# Patient Record
Sex: Male | Born: 1940 | Race: White | Hispanic: No | Marital: Married | Smoking: Former smoker
Health system: Southern US, Community
[De-identification: ages and names within clinical notes are randomized; demographics above are authoritative.]

## PROBLEM LIST (undated history)

## (undated) DIAGNOSIS — Z85118 Personal history of other malignant neoplasm of bronchus and lung: Secondary | ICD-10-CM

## (undated) DIAGNOSIS — M47818 Spondylosis without myelopathy or radiculopathy, sacral and sacrococcygeal region: Secondary | ICD-10-CM

## (undated) DIAGNOSIS — Z950 Presence of cardiac pacemaker: Secondary | ICD-10-CM

## (undated) DIAGNOSIS — J302 Other seasonal allergic rhinitis: Secondary | ICD-10-CM

## (undated) DIAGNOSIS — N401 Enlarged prostate with lower urinary tract symptoms: Secondary | ICD-10-CM

## (undated) DIAGNOSIS — I209 Angina pectoris, unspecified: Secondary | ICD-10-CM

## (undated) DIAGNOSIS — Z9889 Other specified postprocedural states: Secondary | ICD-10-CM

## (undated) DIAGNOSIS — R55 Syncope and collapse: Secondary | ICD-10-CM

## (undated) DIAGNOSIS — I272 Pulmonary hypertension, unspecified: Secondary | ICD-10-CM

## (undated) DIAGNOSIS — R972 Elevated prostate specific antigen [PSA]: Secondary | ICD-10-CM

## (undated) DIAGNOSIS — M533 Sacrococcygeal disorders, not elsewhere classified: Secondary | ICD-10-CM

## (undated) DIAGNOSIS — Z7901 Long term (current) use of anticoagulants: Secondary | ICD-10-CM

## (undated) DIAGNOSIS — N138 Other obstructive and reflux uropathy: Secondary | ICD-10-CM

## (undated) DIAGNOSIS — I4891 Unspecified atrial fibrillation: Secondary | ICD-10-CM

## (undated) DIAGNOSIS — I10 Essential (primary) hypertension: Secondary | ICD-10-CM

## (undated) DIAGNOSIS — Z5181 Encounter for therapeutic drug level monitoring: Secondary | ICD-10-CM

## (undated) DIAGNOSIS — I208 Other forms of angina pectoris: Secondary | ICD-10-CM

## (undated) DIAGNOSIS — N200 Calculus of kidney: Secondary | ICD-10-CM

## (undated) DIAGNOSIS — I495 Sick sinus syndrome: Secondary | ICD-10-CM

## (undated) DIAGNOSIS — I480 Paroxysmal atrial fibrillation: Secondary | ICD-10-CM

## (undated) DIAGNOSIS — N411 Chronic prostatitis: Secondary | ICD-10-CM

## (undated) DIAGNOSIS — E781 Pure hyperglyceridemia: Secondary | ICD-10-CM

## (undated) DIAGNOSIS — K219 Gastro-esophageal reflux disease without esophagitis: Secondary | ICD-10-CM

## (undated) DIAGNOSIS — I2510 Atherosclerotic heart disease of native coronary artery without angina pectoris: Secondary | ICD-10-CM

## (undated) HISTORY — DX: Pure hyperglyceridemia: E78.1

## (undated) HISTORY — DX: Chronic prostatitis: N41.1

## (undated) HISTORY — DX: Presence of cardiac pacemaker: Z95.0

## (undated) HISTORY — DX: Atherosclerotic heart disease of native coronary artery without angina pectoris: I25.10

## (undated) HISTORY — DX: Other seasonal allergic rhinitis: J30.2

## (undated) HISTORY — DX: Calculus of kidney: N20.0

## (undated) HISTORY — DX: Paroxysmal atrial fibrillation: I48.0

## (undated) HISTORY — DX: Unspecified atrial fibrillation: I48.91

## (undated) HISTORY — PX: CARDIAC ELECTROPHYSIOLOGY MAPPING AND ABLATION: SHX1292

## (undated) HISTORY — DX: Syncope and collapse: R55

## (undated) HISTORY — DX: Other forms of angina pectoris: I20.8

## (undated) HISTORY — DX: Long term (current) use of anticoagulants: Z79.01

## (undated) HISTORY — DX: Essential (primary) hypertension: I10

## (undated) HISTORY — DX: Gastro-esophageal reflux disease without esophagitis: K21.9

## (undated) HISTORY — DX: Sacrococcygeal disorders, not elsewhere classified: M53.3

## (undated) HISTORY — PX: LUNG LOBECTOMY: SHX167

## (undated) HISTORY — DX: Benign prostatic hyperplasia with lower urinary tract symptoms: N40.1

## (undated) HISTORY — DX: Sick sinus syndrome: I49.5

## (undated) HISTORY — DX: Angina pectoris, unspecified: I20.9

## (undated) HISTORY — DX: Encounter for therapeutic drug level monitoring: Z51.81

## (undated) HISTORY — DX: Pulmonary hypertension, unspecified: I27.20

## (undated) HISTORY — DX: Personal history of other malignant neoplasm of bronchus and lung: Z85.118

## (undated) HISTORY — DX: Other specified postprocedural states: Z98.890

## (undated) HISTORY — DX: Elevated prostate specific antigen (PSA): R97.20

## (undated) HISTORY — DX: Other obstructive and reflux uropathy: N13.8

## (undated) HISTORY — DX: Spondylosis without myelopathy or radiculopathy, sacral and sacrococcygeal region: M47.818

---

## 2006-09-11 ENCOUNTER — Ambulatory Visit: Payer: Self-pay | Admitting: Physician Assistant

## 2008-07-15 ENCOUNTER — Ambulatory Visit: Payer: Self-pay | Admitting: Unknown Physician Specialty

## 2008-08-04 ENCOUNTER — Ambulatory Visit: Payer: Self-pay | Admitting: Unknown Physician Specialty

## 2008-08-25 ENCOUNTER — Ambulatory Visit: Payer: Self-pay | Admitting: Unknown Physician Specialty

## 2009-03-18 ENCOUNTER — Emergency Department: Payer: Self-pay | Admitting: Emergency Medicine

## 2009-03-22 ENCOUNTER — Other Ambulatory Visit: Payer: Self-pay | Admitting: Internal Medicine

## 2009-08-28 ENCOUNTER — Inpatient Hospital Stay: Payer: Self-pay | Admitting: Internal Medicine

## 2009-09-03 ENCOUNTER — Inpatient Hospital Stay: Payer: Self-pay | Admitting: Internal Medicine

## 2009-09-07 ENCOUNTER — Other Ambulatory Visit: Payer: Self-pay | Admitting: Internal Medicine

## 2009-09-07 ENCOUNTER — Inpatient Hospital Stay: Payer: Self-pay | Admitting: Internal Medicine

## 2009-10-15 ENCOUNTER — Ambulatory Visit: Payer: Self-pay | Admitting: Nurse Practitioner

## 2010-02-01 ENCOUNTER — Emergency Department: Payer: Self-pay | Admitting: Emergency Medicine

## 2010-05-05 ENCOUNTER — Ambulatory Visit: Payer: Self-pay | Admitting: Unknown Physician Specialty

## 2010-09-24 ENCOUNTER — Inpatient Hospital Stay: Payer: Self-pay | Admitting: Internal Medicine

## 2011-11-30 ENCOUNTER — Ambulatory Visit: Payer: Self-pay | Admitting: Internal Medicine

## 2011-12-05 ENCOUNTER — Ambulatory Visit: Payer: Self-pay | Admitting: Internal Medicine

## 2011-12-22 ENCOUNTER — Ambulatory Visit: Payer: Self-pay | Admitting: Internal Medicine

## 2012-01-31 ENCOUNTER — Ambulatory Visit: Payer: Self-pay | Admitting: Ophthalmology

## 2012-06-12 ENCOUNTER — Ambulatory Visit: Payer: Self-pay | Admitting: Ophthalmology

## 2012-07-10 HISTORY — PX: PACEMAKER IMPLANT: EP1218

## 2013-01-24 DIAGNOSIS — R972 Elevated prostate specific antigen [PSA]: Secondary | ICD-10-CM

## 2013-01-24 DIAGNOSIS — N411 Chronic prostatitis: Secondary | ICD-10-CM

## 2013-01-24 DIAGNOSIS — G894 Chronic pain syndrome: Secondary | ICD-10-CM | POA: Insufficient documentation

## 2013-01-24 HISTORY — DX: Chronic prostatitis: N41.1

## 2013-01-24 HISTORY — DX: Elevated prostate specific antigen (PSA): R97.20

## 2014-01-20 DIAGNOSIS — I2089 Other forms of angina pectoris: Secondary | ICD-10-CM | POA: Insufficient documentation

## 2014-01-20 DIAGNOSIS — I4891 Unspecified atrial fibrillation: Secondary | ICD-10-CM

## 2014-01-20 DIAGNOSIS — I209 Angina pectoris, unspecified: Secondary | ICD-10-CM

## 2014-01-20 DIAGNOSIS — Z85118 Personal history of other malignant neoplasm of bronchus and lung: Secondary | ICD-10-CM

## 2014-01-20 DIAGNOSIS — I10 Essential (primary) hypertension: Secondary | ICD-10-CM | POA: Insufficient documentation

## 2014-01-20 DIAGNOSIS — R55 Syncope and collapse: Secondary | ICD-10-CM

## 2014-01-20 DIAGNOSIS — K219 Gastro-esophageal reflux disease without esophagitis: Secondary | ICD-10-CM | POA: Insufficient documentation

## 2014-01-20 DIAGNOSIS — I495 Sick sinus syndrome: Secondary | ICD-10-CM

## 2014-01-20 DIAGNOSIS — E781 Pure hyperglyceridemia: Secondary | ICD-10-CM

## 2014-01-20 DIAGNOSIS — M542 Cervicalgia: Secondary | ICD-10-CM | POA: Insufficient documentation

## 2014-01-20 DIAGNOSIS — I208 Other forms of angina pectoris: Secondary | ICD-10-CM

## 2014-01-20 DIAGNOSIS — I2510 Atherosclerotic heart disease of native coronary artery without angina pectoris: Secondary | ICD-10-CM

## 2014-01-20 HISTORY — DX: Unspecified atrial fibrillation: I48.91

## 2014-01-20 HISTORY — DX: Other forms of angina pectoris: I20.89

## 2014-01-20 HISTORY — DX: Essential (primary) hypertension: I10

## 2014-01-20 HISTORY — DX: Angina pectoris, unspecified: I20.9

## 2014-01-20 HISTORY — DX: Other forms of angina pectoris: I20.8

## 2014-01-20 HISTORY — DX: Pure hyperglyceridemia: E78.1

## 2014-01-20 HISTORY — DX: Syncope and collapse: R55

## 2014-01-20 HISTORY — DX: Personal history of other malignant neoplasm of bronchus and lung: Z85.118

## 2014-01-20 HISTORY — DX: Sick sinus syndrome: I49.5

## 2014-01-20 HISTORY — DX: Atherosclerotic heart disease of native coronary artery without angina pectoris: I25.10

## 2014-03-04 DIAGNOSIS — Z9289 Personal history of other medical treatment: Secondary | ICD-10-CM | POA: Insufficient documentation

## 2014-03-04 DIAGNOSIS — Z9889 Other specified postprocedural states: Secondary | ICD-10-CM | POA: Insufficient documentation

## 2014-03-04 HISTORY — DX: Other specified postprocedural states: Z98.890

## 2014-04-02 ENCOUNTER — Ambulatory Visit: Payer: Self-pay | Admitting: Cardiology

## 2014-04-02 LAB — URINALYSIS, COMPLETE
BILIRUBIN, UR: NEGATIVE
Bacteria: NONE SEEN
Blood: NEGATIVE
Glucose,UR: NEGATIVE mg/dL (ref 0–75)
Ketone: NEGATIVE
Leukocyte Esterase: NEGATIVE
Nitrite: NEGATIVE
PROTEIN: NEGATIVE
Ph: 5 (ref 4.5–8.0)
RBC,UR: NONE SEEN /HPF (ref 0–5)
SPECIFIC GRAVITY: 1.017 (ref 1.003–1.030)
Squamous Epithelial: NONE SEEN
WBC UR: <1 /HPF (ref 0–5)

## 2014-04-02 LAB — BASIC METABOLIC PANEL
Anion Gap: 7 (ref 7–16)
BUN: 17 mg/dL (ref 7–18)
CALCIUM: 8.6 mg/dL (ref 8.5–10.1)
CREATININE: 1.12 mg/dL (ref 0.60–1.30)
Chloride: 108 mmol/L — ABNORMAL HIGH (ref 98–107)
Co2: 26 mmol/L (ref 21–32)
EGFR (African American): >60
EGFR (Non-African Amer.): >60
Glucose: 89 mg/dL (ref 65–99)
OSMOLALITY: 282 (ref 275–301)
POTASSIUM: 4.3 mmol/L (ref 3.5–5.1)
Sodium: 141 mmol/L (ref 136–145)

## 2014-04-02 LAB — CBC WITH DIFFERENTIAL/PLATELET
Basophil #: 0.1 10*3/uL (ref 0.0–0.1)
Basophil %: 1.1 %
EOS PCT: 5.4 %
Eosinophil #: 0.5 10*3/uL (ref 0.0–0.7)
HCT: 43 % (ref 40.0–52.0)
HGB: 14.6 g/dL (ref 13.0–18.0)
Lymphocyte #: 2.2 10*3/uL (ref 1.0–3.6)
Lymphocyte %: 21.9 %
MCH: 29.7 pg (ref 26.0–34.0)
MCHC: 34 g/dL (ref 32.0–36.0)
MCV: 87 fL (ref 80–100)
MONO ABS: 0.8 x10 3/mm (ref 0.2–1.0)
Monocyte %: 7.7 %
NEUTROS PCT: 63.9 %
Neutrophil #: 6.4 10*3/uL (ref 1.4–6.5)
Platelet: 234 10*3/uL (ref 150–440)
RBC: 4.93 10*6/uL (ref 4.40–5.90)
RDW: 13.1 % (ref 11.5–14.5)
WBC: 10.1 10*3/uL (ref 3.8–10.6)

## 2014-04-02 LAB — PROTIME-INR
INR: 2.1
PROTHROMBIN TIME: 23.4 s — AB (ref 11.5–14.7)

## 2014-04-02 LAB — APTT: Activated PTT: 46.8 secs — ABNORMAL HIGH (ref 23.6–35.9)

## 2014-04-10 ENCOUNTER — Ambulatory Visit: Payer: Self-pay | Admitting: Cardiology

## 2014-04-10 LAB — APTT: Activated PTT: 29.9 secs (ref 23.6–35.9)

## 2014-04-10 LAB — HEMOGLOBIN: HGB: 13.8 g/dL (ref 13.0–18.0)

## 2014-04-10 LAB — PROTIME-INR
INR: 1.1
Prothrombin Time: 14.2 secs (ref 11.5–14.7)

## 2014-06-15 DIAGNOSIS — Z950 Presence of cardiac pacemaker: Secondary | ICD-10-CM

## 2014-06-15 HISTORY — DX: Presence of cardiac pacemaker: Z95.0

## 2014-07-17 DIAGNOSIS — I272 Pulmonary hypertension, unspecified: Secondary | ICD-10-CM

## 2014-07-17 DIAGNOSIS — Z5181 Encounter for therapeutic drug level monitoring: Secondary | ICD-10-CM | POA: Insufficient documentation

## 2014-07-17 DIAGNOSIS — N200 Calculus of kidney: Secondary | ICD-10-CM

## 2014-07-17 DIAGNOSIS — J302 Other seasonal allergic rhinitis: Secondary | ICD-10-CM | POA: Insufficient documentation

## 2014-07-17 DIAGNOSIS — K219 Gastro-esophageal reflux disease without esophagitis: Secondary | ICD-10-CM | POA: Insufficient documentation

## 2014-07-17 DIAGNOSIS — N401 Enlarged prostate with lower urinary tract symptoms: Secondary | ICD-10-CM

## 2014-07-17 DIAGNOSIS — N138 Other obstructive and reflux uropathy: Secondary | ICD-10-CM

## 2014-07-17 DIAGNOSIS — I480 Paroxysmal atrial fibrillation: Secondary | ICD-10-CM

## 2014-07-17 DIAGNOSIS — E781 Pure hyperglyceridemia: Secondary | ICD-10-CM | POA: Insufficient documentation

## 2014-07-17 DIAGNOSIS — R55 Syncope and collapse: Secondary | ICD-10-CM | POA: Insufficient documentation

## 2014-07-17 DIAGNOSIS — Z45018 Encounter for adjustment and management of other part of cardiac pacemaker: Secondary | ICD-10-CM | POA: Insufficient documentation

## 2014-07-17 DIAGNOSIS — I495 Sick sinus syndrome: Secondary | ICD-10-CM

## 2014-07-17 DIAGNOSIS — Z79899 Other long term (current) drug therapy: Secondary | ICD-10-CM | POA: Insufficient documentation

## 2014-07-17 DIAGNOSIS — Z859 Personal history of malignant neoplasm, unspecified: Secondary | ICD-10-CM | POA: Insufficient documentation

## 2014-07-17 HISTORY — DX: Gastro-esophageal reflux disease without esophagitis: K21.9

## 2014-07-17 HISTORY — DX: Syncope and collapse: R55

## 2014-07-17 HISTORY — DX: Pulmonary hypertension, unspecified: I27.20

## 2014-07-17 HISTORY — DX: Other obstructive and reflux uropathy: N13.8

## 2014-07-17 HISTORY — DX: Other obstructive and reflux uropathy: N40.1

## 2014-07-17 HISTORY — DX: Pure hyperglyceridemia: E78.1

## 2014-07-17 HISTORY — DX: Other seasonal allergic rhinitis: J30.2

## 2014-07-17 HISTORY — DX: Sick sinus syndrome: I49.5

## 2014-07-17 HISTORY — DX: Paroxysmal atrial fibrillation: I48.0

## 2014-07-17 HISTORY — DX: Calculus of kidney: N20.0

## 2014-08-10 DIAGNOSIS — M533 Sacrococcygeal disorders, not elsewhere classified: Secondary | ICD-10-CM | POA: Insufficient documentation

## 2014-08-10 HISTORY — DX: Sacrococcygeal disorders, not elsewhere classified: M53.3

## 2014-10-31 NOTE — Op Note (Signed)
PATIENT NAME:  Wayne Marquez, PODOLAK MR#:  [id number] DATE OF BIRTH:  1941-04-12  DATE OF PROCEDURE:  04/10/2014  PROCEDURE: Implantation of a dual-chamber pacemaker.   INDICATIONS:  1.  Sinoatrial node dysfunction.  2.  Paroxysmal atrial fibrillation, on antiarrhythmic drug therapy.   DETAILS OF PROCEDURE: Informed consent was obtained prior to the procedure. The risks and benefits were explained to the patient and the informed consent was placed in the patient's permanent medical record. The patient was brought to the operating room in a fasting, nonsedated state. The patient was prepped and draped in the usual manner. The appropriate resuscitative equipment was attached to the patient. The area of the left infraclavicular fossa was scrubbed with chlorhexidine. Following the completion of the timeout, 20 mL of 1% lidocaine was administered for local anesthetic. Using a scalpel, a 3 cm incision was made in the left infraclavicular fossa. Using a combination of electrocautery and blunt dissection, a pacemaker pocket was fashioned in the usual manner just above the prepectoralis muscle. Access to the central circulation was attained x2 using the modified Seldinger technique and fluoroscopic guidance. Two guidewires were passed through the axillary vein into the area of the inferior vena cava. There seemed to be a large valve or partial occlusion of the axillary vein at the insertion site of the first rib. Sheaths were placed over each guidewire. Over the first sheath the dilator and guidewire were removed. The right ventricular lead was then advanced to the right ventricular outflow tract and subsequently withdrawn to the right ventricular apex. Right ventricular sensing was tested then and noted to be 20.4 mV with a slew rate of greater than 4 V/sec, a threshold of 0.6 V at 0.5 ms and a pacing impedance of 924 ohms. Appropriate redundancy was confirmed and this lead was sewn to the prepectoralis fascia using  an 0 silk suture and adequate redundancy was confirmed using fluoroscopic guidance. Attention was then turned to the second guidewire over which a 7-French safe sheath was advanced. The dilator and guidewire were subsequently removed. The right atrial lead was taken into the area of the right atrium and subsequently positioned in the right atrial appendage. Sensing there showed P waves of 3.8 mV, a slew rate of 1.4 V/sec, a pacing threshold amplitude 0.8 V at 0.5 ms and pacing impedance of 713 ohms. The lead was confirmed to be in satisfactory condition under fluoroscopic guidance and the lead was sewn in place using an 0 silk suture sewing it to the prepectoralis fascia. Both leads were attached to the pacemaker generator, which was placed in the pocket and the pocket was closed with running layers of 2-0 and 3-0 Vicryl and the subcuticular layer with 4-0 V-Loc. Estimated blood loss for the procedure was less than 10 mL. There were no complications apparent at the conclusion of the procedure. The patient was returned to the postoperative recovery unit without difficulty.   SUMMARY OF IMPLANTED HARDWARE: The patient received a Medtronic MRI compatible dual-chamber pacemaker, model Advisa DR MRI [id number], serial [id number]. Data for the right atrial lead is as follows: The patient received a Medtronic 5076 model lead serial [id number]. The RV lead was a Medtronic 5076, model name CapSure Fix Novus. The serial [id number]. Both leads and the device was implanted on April 10, 2014.   FINAL PROGRAM PARAMETERS: The device was programmed mode AAIRDDDR with lower rate limit of 60, upper tracking rate 130 and upper sensory rate 130. The programmed AV delays were paced 180, sensed  150. Capture management was placed to adaptive with outputs of 3.5 at 0.4 V. ____________________________ Priscille Heidelberg. Marcello Moores, MD klt:sb D: 04/10/2014 12:42:34 ET T: 04/10/2014 14:39:15 ET JOB#: [id number]  cc: Lennette Bihari L. Marcello Moores, MD,  <Dictator> Marzetta Board MD ELECTRONICALLY SIGNED 04/30/2014 13:55

## 2015-01-30 ENCOUNTER — Other Ambulatory Visit
Admission: RE | Admit: 2015-01-30 | Discharge: 2015-01-30 | Disposition: A | Discharge status: Home / Self Care | Payer: Medicare Other | Source: Ambulatory Visit | Attending: Family Medicine | Admitting: Family Medicine

## 2015-01-30 DIAGNOSIS — Z7901 Long term (current) use of anticoagulants: Secondary | ICD-10-CM | POA: Insufficient documentation

## 2015-01-30 DIAGNOSIS — H9220 Otorrhagia, unspecified ear: Secondary | ICD-10-CM | POA: Insufficient documentation

## 2015-01-30 LAB — PROTIME-INR
INR: 2.45
Prothrombin Time: 26.7 seconds — ABNORMAL HIGH (ref 11.4–15.0)

## 2016-11-27 DIAGNOSIS — M47818 Spondylosis without myelopathy or radiculopathy, sacral and sacrococcygeal region: Secondary | ICD-10-CM

## 2016-11-27 DIAGNOSIS — M461 Sacroiliitis, not elsewhere classified: Secondary | ICD-10-CM

## 2016-11-27 HISTORY — DX: Sacroiliitis, not elsewhere classified: M46.1

## 2016-11-27 HISTORY — DX: Spondylosis without myelopathy or radiculopathy, sacral and sacrococcygeal region: M47.818

## 2017-04-30 ENCOUNTER — Ambulatory Visit (INDEPENDENT_AMBULATORY_CARE_PROVIDER_SITE_OTHER): Payer: Medicare Other | Admitting: Urology

## 2017-04-30 ENCOUNTER — Encounter: Payer: Self-pay | Admitting: Urology

## 2017-04-30 VITALS — BP 121/72 | HR 81 | Ht 67.0 in | Wt 169.0 lb

## 2017-04-30 DIAGNOSIS — N200 Calculus of kidney: Secondary | ICD-10-CM

## 2017-04-30 DIAGNOSIS — Z85118 Personal history of other malignant neoplasm of bronchus and lung: Secondary | ICD-10-CM

## 2017-04-30 DIAGNOSIS — I209 Angina pectoris, unspecified: Secondary | ICD-10-CM | POA: Insufficient documentation

## 2017-04-30 DIAGNOSIS — J302 Other seasonal allergic rhinitis: Secondary | ICD-10-CM | POA: Insufficient documentation

## 2017-04-30 DIAGNOSIS — K219 Gastro-esophageal reflux disease without esophagitis: Secondary | ICD-10-CM | POA: Insufficient documentation

## 2017-04-30 DIAGNOSIS — Z5181 Encounter for therapeutic drug level monitoring: Secondary | ICD-10-CM

## 2017-04-30 DIAGNOSIS — N400 Enlarged prostate without lower urinary tract symptoms: Secondary | ICD-10-CM

## 2017-04-30 DIAGNOSIS — R972 Elevated prostate specific antigen [PSA]: Secondary | ICD-10-CM | POA: Diagnosis not present

## 2017-04-30 DIAGNOSIS — Z7901 Long term (current) use of anticoagulants: Secondary | ICD-10-CM

## 2017-04-30 DIAGNOSIS — R55 Syncope and collapse: Secondary | ICD-10-CM | POA: Insufficient documentation

## 2017-04-30 DIAGNOSIS — N401 Enlarged prostate with lower urinary tract symptoms: Secondary | ICD-10-CM

## 2017-04-30 DIAGNOSIS — N411 Chronic prostatitis: Secondary | ICD-10-CM

## 2017-04-30 DIAGNOSIS — Z79899 Other long term (current) drug therapy: Secondary | ICD-10-CM | POA: Insufficient documentation

## 2017-04-30 DIAGNOSIS — I495 Sick sinus syndrome: Secondary | ICD-10-CM | POA: Insufficient documentation

## 2017-04-30 HISTORY — DX: Calculus of kidney: N20.0

## 2017-04-30 HISTORY — DX: Encounter for therapeutic drug level monitoring: Z51.81

## 2017-04-30 HISTORY — DX: Encounter for therapeutic drug level monitoring: Z79.01

## 2017-04-30 HISTORY — DX: Personal history of other malignant neoplasm of bronchus and lung: Z85.118

## 2017-04-30 LAB — BLADDER SCAN AMB NON-IMAGING

## 2017-04-30 MED ORDER — FINASTERIDE 5 MG PO TABS: 5.0000 mg | ORAL_TABLET | Freq: Every day | ORAL | 3 refills | Status: DC

## 2017-04-30 MED ORDER — TAMSULOSIN HCL 0.4 MG PO CAPS: 0.4000 mg | ORAL_CAPSULE | Freq: Every day | ORAL | 3 refills | Status: DC

## 2017-04-30 NOTE — Progress Notes (Signed)
04/30/2017 9:20 AM   Chinita Pester Jul 08, 1941 [id number]  Referring provider: No referring provider defined for this encounter.  Chief Complaint  Patient presents with  . Benign Prostatic Hypertrophy    4-14month follow up    HPI: 76 y.o. male who presents followed for BPH, elevated PSA and chronic prostatitis. Prostate biopsy was performed in 2002 for PSA of 6.3 with benign pathology. His PSA has remained stable and was 5.64 in July 2018. He had a cardiac ablation procedure in January 2016 and had urinary retention post procedure. At last year's visit he noted increased urinary hesitancy, frequency and his tamsulosin was increased to 0.8 mg. He states this helped initially however I saw him at [facility] in July 2018 for worsening voiding symptoms including sensation of incomplete emptying, frequency, intermittency, urgency, weak urinary stream and nocturia 2. IPSS was 20/35 with a QOL rated 6/6.  Cystoscopy was performed in August 2018 which showed coapting lateral lobes and a moderate intravesical median lobe.  Surgical management was discussed however he elected combination medical therapy and was started on finasteride.  He was not due for follow-up today however states he was told at [facility] he would need to make an appointment with me in [place] to obtain a medication refill.  His voiding pattern is stable.   PMH: Past Medical History:  Diagnosis Date  . Angina of effort ([place]) 01/20/2014  . Angina pectoris ([place]) 01/20/2014  . Anticoagulation goal of INR 2 to 3 04/30/2017   Overview:  coumadin; CHADSvasc=3 age, htn, cad  . 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 Atrial fibrillation ([place]) 01/20/2014  . 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 CAD (coronary artery disease), native coronary artery 01/20/2014  . 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   Overview:  Medtronic Advisa  . 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 Elevated prostate specific antigen (PSA)  01/24/2013  . Gastroesophageal reflux disease 07/17/2014  . History of biliary T-tube placement 03/04/2014   Overview:  Overview:  EF>55%, mild LVH, mod MR, mod TR, RVSP=5mmHg TTE 8/26./2015  . History of lung cancer 04/30/2017  . Hyperglyceridemia 01/20/2014  . Hypertension, benign 01/20/2014  . Kidney stone 07/17/2014  . Nephrolithiasis 04/30/2017  . Paroxysmal atrial fibrillation (HCC) 07/17/2014   Overview:  Symptomatic, Drug Refractory; SSS with dual chamber pacemaker in situ  Overview:  Overview:  Symptomatic, Drug Refractory; SSS with dual chamber pacemaker in situ  . Personal history of malignant neoplasm of bronchus and lung 01/20/2014  . Pre-syncope 07/17/2014  . Presence of cardiac pacemaker 06/15/2014   Overview:  Overview:  Medtronic Advisa  . Pulmonary hypertension ([place]) 07/17/2014   Overview:  RVSP=3mmHG by TTE 03/04/2014  Overview:  Overview:  RVSP=9mmHG by TTE 03/04/2014  . 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 Seasonal allergic rhinitis 07/17/2014   Overview:  Overview:  allergic rhinitis  . SI (sacroiliac) joint dysfunction 08/10/2014  . Sick sinus syndrome ([place]) 07/17/2014   Overview:  Overview:  sinoatrial node dysfunction with dual chamber pacemaker in situ; paroxysmal AFib  . Sinoatrial node dysfunction (HCC) 01/20/2014  . Syncope and collapse 01/20/2014    Surgical History: Past Surgical History:  Procedure Laterality Date  . CARDIAC ELECTROPHYSIOLOGY MAPPING AND ABLATION    . PACEMAKER IMPLANT  2014    Home Medications:  Allergies as of 04/30/2017      Reactions   Sulfa Antibiotics    Other reaction(s): Other (See Comments)      Medication List  Accurate as of 04/30/17  9:20 AM. Always use your most recent med list.          ELIQUIS 5 MG Tabs tablet Generic drug:  apixaban   finasteride 5 MG tablet Commonly known as:  PROSCAR   losartan 25 MG tablet Commonly known as:  COZAAR   omeprazole 20 MG capsule Commonly known as:  PRILOSEC   simvastatin 40  MG tablet Commonly known as:  ZOCOR   sotalol 120 MG tablet Commonly known as:  BETAPACE   tamsulosin 0.4 MG Caps capsule Commonly known as:  FLOMAX       Allergies:  Allergies  Allergen Reactions  . Sulfa Antibiotics     Other reaction(s): Other (See Comments)    Family History: Family History  Problem Relation Age of Onset  . Bladder Cancer Neg Hx   . Kidney cancer Neg Hx   . Prostate cancer Neg Hx     Social History:  reports that he has quit smoking. He has quit using smokeless tobacco. He reports that he does not drink alcohol or use drugs.  ROS: UROLOGY Frequent Urination?: No Hard to postpone urination?: No Burning/pain with urination?: No Get up at night to urinate?: Yes Leakage of urine?: No Urine stream starts and stops?: No Trouble starting stream?: No Do you have to strain to urinate?: No Blood in urine?: No Urinary tract infection?: No Sexually transmitted disease?: No Injury to kidneys or bladder?: No Painful intercourse?: No Weak stream?: No Erection problems?: No Penile pain?: No  Gastrointestinal Nausea?: No Vomiting?: No Indigestion/heartburn?: No Diarrhea?: No Constipation?: No  Constitutional Fever: No Night sweats?: No Weight loss?: No Fatigue?: No  Skin Skin rash/lesions?: No Itching?: No  Eyes Blurred vision?: No Double vision?: No  Ears/Nose/Throat Sore throat?: Yes Sinus problems?: No  Hematologic/Lymphatic Swollen glands?: No Easy bruising?: No  Cardiovascular Leg swelling?: No Chest pain?: No  Respiratory Cough?: No Shortness of breath?: No  Endocrine Excessive thirst?: No  Musculoskeletal Back pain?: No Joint pain?: No  Neurological Headaches?: No Dizziness?: No  Psychologic Depression?: No Anxiety?: No  Physical Exam: BP 121/72   Pulse 81   Ht 5\' 7"  (1.702 m)   Wt 169 lb (76.7 kg)   BMI 26.47 kg/m   Constitutional:  Alert and oriented, No acute distress. HEENT: [place] AT, moist mucus  membranes.  Trachea midline, no masses. Cardiovascular: No clubbing, cyanosis, or edema. Respiratory: Normal respiratory effort, no increased work of breathing. GI: Abdomen is soft, nontender, nondistended, no abdominal masses GU: No CVA tenderness.  Skin: No rashes, bruises or suspicious lesions. Lymph: No cervical or inguinal adenopathy. Neurologic: Grossly intact, no focal deficits, moving all 4 extremities. Psychiatric: Normal mood and affect.  Laboratory Data: Lab Results  Component Value Date   WBC 10.1 04/02/2014   HGB 13.8 04/10/2014   HCT 43.0 04/02/2014   MCV 87 04/02/2014   PLT 234 04/02/2014    Lab Results  Component Value Date   CREATININE 1.12 04/02/2014    Urinalysis Lab Results  Component Value Date   APPEARANCEUR Clear 04/02/2014   LEUKOCYTESUR Negative 04/02/2014   PROTEINUR Negative 04/02/2014   GLUCOSEU Negative 04/02/2014   RBCU NONE SEEN 04/02/2014   BILIRUBINUR Negative 04/02/2014   NITRITE Negative 04/02/2014    Lab Results  Component Value Date   BACTERIA NONE SEEN 04/02/2014     Assessment & Plan:    1. Benign prostatic hyperplasia, unspecified whether lower urinary tract symptoms present Voiding pattern is stable.  He has not been on finasteride line of to see appreciable efficacy and will continue.  Recommend follow-up 6 months.  PVR by bladder scan was stable at 101 mL.   - BLADDER SCAN AMB NON-IMAGING    Abbie Sons, MD  [facility] [address] [place], [place] [address] [phone]

## 2017-06-02 ENCOUNTER — Encounter: Payer: Self-pay | Admitting: Emergency Medicine

## 2017-06-02 ENCOUNTER — Other Ambulatory Visit: Payer: Self-pay

## 2017-06-02 ENCOUNTER — Emergency Department
Admission: EM | Admit: 2017-06-02 | Discharge: 2017-06-03 | Disposition: A | Discharge status: Home / Self Care | Payer: Medicare Other | Attending: Emergency Medicine | Admitting: Emergency Medicine

## 2017-06-02 DIAGNOSIS — R079 Chest pain, unspecified: Secondary | ICD-10-CM

## 2017-06-02 DIAGNOSIS — Z79899 Other long term (current) drug therapy: Secondary | ICD-10-CM | POA: Diagnosis not present

## 2017-06-02 DIAGNOSIS — R0602 Shortness of breath: Secondary | ICD-10-CM | POA: Insufficient documentation

## 2017-06-02 DIAGNOSIS — I4891 Unspecified atrial fibrillation: Secondary | ICD-10-CM | POA: Insufficient documentation

## 2017-06-02 DIAGNOSIS — I2510 Atherosclerotic heart disease of native coronary artery without angina pectoris: Secondary | ICD-10-CM | POA: Insufficient documentation

## 2017-06-02 DIAGNOSIS — R0789 Other chest pain: Secondary | ICD-10-CM | POA: Diagnosis not present

## 2017-06-02 DIAGNOSIS — Z950 Presence of cardiac pacemaker: Secondary | ICD-10-CM | POA: Diagnosis not present

## 2017-06-02 DIAGNOSIS — I10 Essential (primary) hypertension: Secondary | ICD-10-CM | POA: Insufficient documentation

## 2017-06-02 DIAGNOSIS — Z87891 Personal history of nicotine dependence: Secondary | ICD-10-CM | POA: Insufficient documentation

## 2017-06-02 DIAGNOSIS — Z7901 Long term (current) use of anticoagulants: Secondary | ICD-10-CM | POA: Insufficient documentation

## 2017-06-02 LAB — CBC
HCT: 44.5 % (ref 40.0–52.0)
Hemoglobin: 15.1 g/dL (ref 13.0–18.0)
MCH: 30.5 pg (ref 26.0–34.0)
MCHC: 33.9 g/dL (ref 32.0–36.0)
MCV: 89.8 fL (ref 80.0–100.0)
PLATELETS: 207 10*3/uL (ref 150–440)
RBC: 4.95 MIL/uL (ref 4.40–5.90)
RDW: 12.9 % (ref 11.5–14.5)
WBC: 13.6 10*3/uL — AB (ref 3.8–10.6)

## 2017-06-02 MED ORDER — ASPIRIN 81 MG PO CHEW
324.0000 mg | CHEWABLE_TABLET | Freq: Once | ORAL | Status: AC
  Administered 2017-06-03: 324 mg via ORAL

## 2017-06-02 MED ORDER — ASPIRIN 81 MG PO CHEW
CHEWABLE_TABLET | ORAL | Status: AC
  Filled 2017-06-02: qty 4

## 2017-06-02 MED ORDER — ONDANSETRON HCL 4 MG/2ML IJ SOLN
INTRAMUSCULAR | Status: DC
Start: 2017-06-02 — End: 2017-06-03
  Filled 2017-06-02: qty 2

## 2017-06-02 MED ORDER — MORPHINE SULFATE (PF) 4 MG/ML IV SOLN
4.0000 mg | Freq: Once | INTRAVENOUS | Status: AC
  Administered 2017-06-03: 4 mg via INTRAVENOUS

## 2017-06-02 MED ORDER — MORPHINE SULFATE (PF) 4 MG/ML IV SOLN
INTRAVENOUS | Status: AC
  Filled 2017-06-02: qty 1

## 2017-06-02 MED ORDER — ONDANSETRON HCL 4 MG/2ML IJ SOLN
4.0000 mg | Freq: Once | INTRAMUSCULAR | Status: AC
  Administered 2017-06-03: 4 mg via INTRAVENOUS

## 2017-06-02 NOTE — ED Triage Notes (Signed)
Pt arrives POV with chest pain accompanied with SOB. Pt reports sudden sharp chest pain that radiates to his back. Pt is diaphoretic at this time in triage.

## 2017-06-03 ENCOUNTER — Encounter: Payer: Self-pay | Admitting: Radiology

## 2017-06-03 ENCOUNTER — Emergency Department: Payer: Medicare Other

## 2017-06-03 DIAGNOSIS — R0789 Other chest pain: Secondary | ICD-10-CM | POA: Diagnosis not present

## 2017-06-03 LAB — HEPATIC FUNCTION PANEL
ALBUMIN: 3.4 g/dL — AB (ref 3.5–5.0)
ALK PHOS: 75 U/L (ref 38–126)
ALT: 29 U/L (ref 17–63)
AST: 27 U/L (ref 15–41)
BILIRUBIN TOTAL: 0.9 mg/dL (ref 0.3–1.2)
Bilirubin, Direct: 0.1 mg/dL (ref 0.1–0.5)
Indirect Bilirubin: 0.8 mg/dL (ref 0.3–0.9)
Total Protein: 6.5 g/dL (ref 6.5–8.1)

## 2017-06-03 LAB — PROTIME-INR
INR: 1.08
Prothrombin Time: 13.9 seconds (ref 11.4–15.2)

## 2017-06-03 LAB — TROPONIN I
Troponin I: <0.03 ng/mL (ref <0.03)
Troponin I: <0.03 ng/mL (ref <0.03)

## 2017-06-03 LAB — BASIC METABOLIC PANEL
Anion gap: 10 (ref 5–15)
BUN: 20 mg/dL (ref 6–20)
CALCIUM: 8.6 mg/dL — AB (ref 8.9–10.3)
CO2: 23 mmol/L (ref 22–32)
CREATININE: 1.45 mg/dL — AB (ref 0.61–1.24)
Chloride: 104 mmol/L (ref 101–111)
GFR calc non Af Amer: 46 mL/min — ABNORMAL LOW (ref >60)
GFR, EST AFRICAN AMERICAN: 53 mL/min — AB (ref >60)
GLUCOSE: 148 mg/dL — AB (ref 65–99)
Potassium: 4.5 mmol/L (ref 3.5–5.1)
Sodium: 137 mmol/L (ref 135–145)

## 2017-06-03 LAB — LIPASE, BLOOD: Lipase: 47 U/L (ref 11–51)

## 2017-06-03 MED ORDER — IOPAMIDOL (ISOVUE-370) INJECTION 76%
75.0000 mL | Freq: Once | INTRAVENOUS | Status: AC | PRN
  Administered 2017-06-03: 75 mL via INTRAVENOUS

## 2017-06-03 NOTE — ED Notes (Signed)
Explanation of repeat troponin need and expected result time provided to pt and spouse who verbalize understanding.

## 2017-06-03 NOTE — Discharge Instructions (Signed)
Your labs and CT scan did not show a cause for your pain at this time. If the pain returns it is important that you return immediately to the ER for further evaluation otherwise call Dr. Etta Quill office Monday morning for a close follow up appointment.

## 2017-06-03 NOTE — ED Notes (Signed)
Patient transported to CT 

## 2017-06-03 NOTE — ED Provider Notes (Signed)
[facility] Emergency Department Provider Note  ____________________________________________  Time seen: Approximately 12:05 AM  I have reviewed the triage vital signs and the nursing notes.   HISTORY  Chief Complaint Chest Pain   HPI Wayne Marquez is a 76 y.o. male with history of CAD, A. fib status post ablation on Eliquis, angina, pacemaker, GERD, lung cancer status post resection, hypertension, hyperlipidemia who presents for evaluation of chest pain and shortness of breath. Patient reports that his symptoms started this afternoon while he was on his recliner. He is complaining of a sharp moderate constant pain located in the left side of his chest radiating straight to his back associated with shortness of breath. Pain became worse this evening when he laid in bed to go to sleep which prompted his visit to the ER. Pain is not pleuritic in nature, no nausea, no vomiting, no cough, no fever or chills, no diarrhea, no constipation, no melena, no abdominal pain, no diaphoresis, no dizziness. Patient denies ever having similar pain. Patient has not tried anything at home for the pain.patient denies personal or family history of blood clots, recent travel or immobilization, leg pain or swelling, hemoptysis, exogenous hormones.  Past Medical History:  Diagnosis Date  . Angina of effort ([place]) 01/20/2014  . Angina pectoris ([place]) 01/20/2014  . Anticoagulation goal of INR 2 to 3 04/30/2017   Overview:  coumadin; CHADSvasc=3 age, htn, cad  . 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 Atrial fibrillation ([place]) 01/20/2014  . 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 CAD (coronary artery disease), native coronary artery 01/20/2014  . 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   Overview:  Medtronic Advisa  . 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 Elevated prostate specific antigen (PSA) 01/24/2013  . Gastroesophageal reflux disease 07/17/2014  .  History of biliary T-tube placement 03/04/2014   Overview:  Overview:  EF>55%, mild LVH, mod MR, mod TR, RVSP=28mmHg TTE 8/26./2015  . History of lung cancer 04/30/2017  . Hyperglyceridemia 01/20/2014  . Hypertension, benign 01/20/2014  . Kidney stone 07/17/2014  . Nephrolithiasis 04/30/2017  . Paroxysmal atrial fibrillation (HCC) 07/17/2014   Overview:  Symptomatic, Drug Refractory; SSS with dual chamber pacemaker in situ  Overview:  Overview:  Symptomatic, Drug Refractory; SSS with dual chamber pacemaker in situ  . Personal history of malignant neoplasm of bronchus and lung 01/20/2014  . Pre-syncope 07/17/2014  . Presence of cardiac pacemaker 06/15/2014   Overview:  Overview:  Medtronic Advisa  . Pulmonary hypertension ([place]) 07/17/2014   Overview:  RVSP=21mmHG by TTE 03/04/2014  Overview:  Overview:  RVSP=24mmHG by TTE 03/04/2014  . 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 Seasonal allergic rhinitis 07/17/2014   Overview:  Overview:  allergic rhinitis  . SI (sacroiliac) joint dysfunction 08/10/2014  . Sick sinus syndrome ([place]) 07/17/2014   Overview:  Overview:  sinoatrial node dysfunction with dual chamber pacemaker in situ; paroxysmal AFib  . Sinoatrial node dysfunction (HCC) 01/20/2014  . Syncope and collapse 01/20/2014    Patient Active Problem List   Diagnosis Date Noted  . Anticoagulation goal of INR 2 to 3 04/30/2017  . BPH (benign prostatic hyperplasia) 04/30/2017  . GERD (gastroesophageal reflux disease) 04/30/2017  . High risk medication use 04/30/2017  . History of lung cancer 04/30/2017  . Nephrolithiasis 04/30/2017  . Other and unspecified angina pectoris 04/30/2017  . Seasonal allergies 04/30/2017  . SSS (sick sinus syndrome) ([place]) 04/30/2017  . Syncope, near 04/30/2017  . Arthritis of sacroiliac joint  of both sides ([place]) 11/27/2016  . SI (sacroiliac) joint dysfunction 08/10/2014  . 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 Kidney stone 07/17/2014  . Encounter for  therapeutic drug monitoring 07/17/2014  . Fitting or adjustment of cardiac pacemaker 07/17/2014  . Gastroesophageal reflux disease 07/17/2014  . History of malignant neoplasm 07/17/2014  . Hypertriglyceridemia 07/17/2014  . Paroxysmal atrial fibrillation ([place]) 07/17/2014  . Polypharmacy 07/17/2014  . Pre-syncope 07/17/2014  . Pulmonary hypertension ([place]) 07/17/2014  . 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 Seasonal allergic rhinitis 07/17/2014  . Sick sinus syndrome ([place]) 07/17/2014  . 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  . Presence of cardiac pacemaker 06/15/2014  . H/O echocardiogram 03/04/2014  . History of biliary T-tube placement 03/04/2014  . Angina of effort ([place]) 01/20/2014  . Angina pectoris ([place]) 01/20/2014  . Atrial fibrillation ([place]) 01/20/2014  . Neck pain 01/20/2014  . CAD (coronary artery disease), native coronary artery 01/20/2014  . Esophageal reflux 01/20/2014  . Hyperglyceridemia 01/20/2014  . Hypertension, benign 01/20/2014  . Personal history of malignant neoplasm of bronchus and lung 01/20/2014  . Sinoatrial node dysfunction (HCC) 01/20/2014  . Syncope and collapse 01/20/2014  . 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 Elevated prostate specific antigen (PSA) 01/24/2013    Past Surgical History:  Procedure Laterality Date  . CARDIAC ELECTROPHYSIOLOGY MAPPING AND ABLATION    . PACEMAKER IMPLANT  2014    Prior to Admission medications   Medication Sig Start Date End Date Taking? Authorizing Provider  ELIQUIS 5 MG TABS tablet  04/16/17   [provider]  finasteride (PROSCAR) 5 MG tablet Take 1 tablet (5 mg total) by mouth daily. 04/30/17   Stoioff, Ronda Fairly, MD  losartan (COZAAR) 25 MG tablet  02/19/17   [provider]  omeprazole (PRILOSEC) 20 MG capsule  03/05/17   [provider]  simvastatin (ZOCOR) 40 MG tablet  03/26/17   [provider]  sotalol (BETAPACE) 120 MG tablet  03/05/17   [provider]  tamsulosin (FLOMAX)  0.4 MG CAPS capsule Take 1 capsule (0.4 mg total) by mouth daily. 04/30/17   Stoioff, Ronda Fairly, MD    Allergies Sulfa antibiotics  Family History  Problem Relation Age of Onset  . Bladder Cancer Neg Hx   . Kidney cancer Neg Hx   . Prostate cancer Neg Hx     Social History Social History   Tobacco Use  . Smoking status: Former Research scientist (life sciences)  . Smokeless tobacco: Former Network engineer Use Topics  . Alcohol use: No  . Drug use: No    Review of Systems  Constitutional: Negative for fever. Eyes: Negative for visual changes. ENT: Negative for sore throat. Neck: No neck pain  Cardiovascular: + chest pain. Respiratory: + shortness of breath. Gastrointestinal: Negative for abdominal pain, vomiting or diarrhea. Genitourinary: Negative for dysuria. Musculoskeletal: Negative for back pain. Skin: Negative for rash. Neurological: Negative for headaches, weakness or numbness. Psych: No SI or HI  ____________________________________________   PHYSICAL EXAM:  VITAL SIGNS: ED Triage Vitals  Enc Vitals Group     BP 06/02/17 2353 (!) 147/103     Pulse Rate 06/02/17 2353 80     Resp 06/02/17 2353 (!) 22     Temp 06/02/17 2353 98.1 F (36.7 C)     Temp Source 06/02/17 2353 Oral     SpO2 06/02/17 2353 96 %     Weight 06/02/17 2356 175 lb (79.4 kg)     Height 06/02/17 2356 5\' 7"  (1.702 m)  Head Circumference --      Peak Flow --      Pain Score 06/03/17 0002 8     Pain Loc --      Pain Edu? --      Excl. in [place]? --     Constitutional: Alert and oriented. Well appearing and in no apparent distress. HEENT:      Head: Normocephalic and atraumatic.         Eyes: Conjunctivae are normal. Sclera is non-icteric.       Mouth/Throat: Mucous membranes are moist.       Neck: Supple with no signs of meningismus. Cardiovascular: Regular rate and rhythm. No murmurs, gallops, or rubs. 2+ symmetrical distal pulses are present in all extremities. No JVD. Respiratory: Normal respiratory  effort. Lungs are clear to auscultation bilaterally. No wheezes, crackles, or rhonchi.  Gastrointestinal: Soft, non tender, and non distended with positive bowel sounds. No rebound or guarding. Genitourinary: No CVA tenderness. Musculoskeletal: Nontender with normal range of motion in all extremities. No edema, cyanosis, or erythema of extremities. Neurologic: Normal speech and language. Face is symmetric. Moving all extremities. No gross focal neurologic deficits are appreciated. Skin: Skin is warm, dry and intact. No rash noted. Psychiatric: Mood and affect are normal. Speech and behavior are normal.  ____________________________________________   LABS (all labs ordered are listed, but only abnormal results are displayed)  Labs Reviewed  BASIC METABOLIC PANEL - Abnormal; Notable for the following components:      Result Value   Glucose, Bld 148 (*)    Creatinine, Ser 1.45 (*)    Calcium 8.6 (*)    GFR calc non Af Amer 46 (*)    GFR calc Af Amer 53 (*)    All other components within normal limits  CBC - Abnormal; Notable for the following components:   WBC 13.6 (*)    All other components within normal limits  HEPATIC FUNCTION PANEL - Abnormal; Notable for the following components:   Albumin 3.4 (*)    All other components within normal limits  TROPONIN I  PROTIME-INR  LIPASE, BLOOD  TROPONIN I   ____________________________________________  EKG  ED ECG REPORT I, Rudene Re, the attending physician, personally viewed and interpreted this ECG.  Normal sinus rhythm, rate of 75, normal intervals, normal axis, no ST elevations or depressions, T-wave inversion in 1 and aVL.unchanged from prior ____________________________________________  RADIOLOGY  CXR:1. Mild hyperinflation of the lungs with apical pleuroparenchymal thickening and scarring. 2. Trace left effusion with probable left basilar atelectasis. 3. 5 mm granuloma suspected at the right lung base.  CTA  chest:  1. No evidence of aortic aneurysm or dissection. Aberrant right subclavian artery. Aortic atherosclerosis. No evidence of PE 2. Chronic scarring in the lungs. No evidence of active pulmonary disease. 3. Mild diffuse fatty infiltration of the liver. Bilateral renal cysts. ____________________________________________   PROCEDURES  Procedure(s) performed: None Procedures Critical Care performed:  None ____________________________________________   INITIAL IMPRESSION / ASSESSMENT AND PLAN / ED COURSE   76 y.o. male with history of CAD, A. fib status post ablation on Eliquis, angina, pacemaker, GERD, lung cancer status post resection, hypertension, hyperlipidemia who presents for evaluation of several hours of constant sharp L chest pain radiating to his back associated with shortness of breath. patient is well-appearing, in no distress, normal vital signs, lungs are clear to auscultation. EKG shows no evidence of ischemia or arrhythmias. Labs and CXR pending. Patient placed on telemetry. Will give morphine, zofran and  asa for symptom relief. Ddx includes ACS (although less likely with ongoing pain for hours and normal EKG) vs PE vs PNA vs PTX vs dissection vs kidney stone vs pancreatitis. EKG shows no ischemia in the setting of several hours of constant pain. Will continue to monitor and send patient for CT angio of the chest.     ----------------------------------------- 11:54 PM on 06/02/2017 ----------------------------------------- OBSERVATION CARE: This patient is being placed under observation care for the following reasons: Chest pain with repeat testing to rule out ischemia  ----------------------------------------- 1:35 AM on 06/03/2017 ----------------------------------------- patient remains pain-free after an aspirin and morphine. CT angiogram and no evidence of PE, dissection, pneumothorax, or pneumonia. First troponin is negative. Second troponin is due at 2:50 AM.  We'll continue to monitor close on telemetry.  ----------------------------------------- 3:35 AM on 06/03/2017 ----------------------------------------- END OF OBSERVATION STATUS: After an appropriate period of observation, this patient is being discharged due to the following reason(s):  patient remains without any pain. Troponin 2 negative. At this time patient's evaluation essentially unremarkable with no acute findings. I believe patient is safe for outpatient follow-up with his cardiologist on Monday. In the meantime recommended that he return to the emergency room if the pain recurs. Patient is comfortable with this plan and is going to be discharged home at this time.     As part of my medical decision making, I reviewed the following data within the [facility] notes reviewed and incorporated, Labs reviewed , EKG interpreted , Old EKG reviewed, Old chart reviewed, Radiograph reviewed , Notes from prior ED visits and [place] Controlled Substance Database    Pertinent labs & imaging results that were available during my care of the patient were reviewed by me and considered in my medical decision making (see chart for details).    ____________________________________________   FINAL CLINICAL IMPRESSION(S) / ED DIAGNOSES  Final diagnoses:  Chest pain, unspecified type      NEW MEDICATIONS STARTED DURING THIS VISIT:  ED Discharge Orders    None       Note:  This document was prepared using Dragon voice recognition software and may include unintentional dictation errors.    Rudene Re, MD 06/03/17 [phone]

## 2017-06-03 NOTE — ED Notes (Signed)
Patient transported to X-ray 

## 2017-09-20 ENCOUNTER — Encounter
Admission: RE | Disposition: A | Discharge status: Home / Self Care | Payer: Self-pay | Source: Ambulatory Visit | Attending: Internal Medicine

## 2017-09-20 ENCOUNTER — Ambulatory Visit
Admission: RE | Admit: 2017-09-20 | Discharge: 2017-09-20 | Disposition: A | Discharge status: Home / Self Care | Payer: Medicare Other | Source: Ambulatory Visit | Attending: Internal Medicine | Admitting: Internal Medicine

## 2017-09-20 DIAGNOSIS — Z882 Allergy status to sulfonamides status: Secondary | ICD-10-CM | POA: Diagnosis not present

## 2017-09-20 DIAGNOSIS — Z950 Presence of cardiac pacemaker: Secondary | ICD-10-CM | POA: Diagnosis not present

## 2017-09-20 DIAGNOSIS — I10 Essential (primary) hypertension: Secondary | ICD-10-CM | POA: Diagnosis not present

## 2017-09-20 DIAGNOSIS — Z833 Family history of diabetes mellitus: Secondary | ICD-10-CM | POA: Insufficient documentation

## 2017-09-20 DIAGNOSIS — I495 Sick sinus syndrome: Secondary | ICD-10-CM | POA: Diagnosis not present

## 2017-09-20 DIAGNOSIS — Z85118 Personal history of other malignant neoplasm of bronchus and lung: Secondary | ICD-10-CM | POA: Insufficient documentation

## 2017-09-20 DIAGNOSIS — I272 Pulmonary hypertension, unspecified: Secondary | ICD-10-CM | POA: Insufficient documentation

## 2017-09-20 DIAGNOSIS — Z79899 Other long term (current) drug therapy: Secondary | ICD-10-CM | POA: Insufficient documentation

## 2017-09-20 DIAGNOSIS — Z955 Presence of coronary angioplasty implant and graft: Secondary | ICD-10-CM | POA: Insufficient documentation

## 2017-09-20 DIAGNOSIS — K219 Gastro-esophageal reflux disease without esophagitis: Secondary | ICD-10-CM | POA: Insufficient documentation

## 2017-09-20 DIAGNOSIS — Z87891 Personal history of nicotine dependence: Secondary | ICD-10-CM | POA: Diagnosis not present

## 2017-09-20 DIAGNOSIS — N400 Enlarged prostate without lower urinary tract symptoms: Secondary | ICD-10-CM | POA: Insufficient documentation

## 2017-09-20 DIAGNOSIS — Z888 Allergy status to other drugs, medicaments and biological substances status: Secondary | ICD-10-CM | POA: Diagnosis not present

## 2017-09-20 DIAGNOSIS — I25110 Atherosclerotic heart disease of native coronary artery with unstable angina pectoris: Secondary | ICD-10-CM | POA: Insufficient documentation

## 2017-09-20 DIAGNOSIS — R55 Syncope and collapse: Secondary | ICD-10-CM | POA: Insufficient documentation

## 2017-09-20 DIAGNOSIS — E781 Pure hyperglyceridemia: Secondary | ICD-10-CM | POA: Insufficient documentation

## 2017-09-20 DIAGNOSIS — Z808 Family history of malignant neoplasm of other organs or systems: Secondary | ICD-10-CM | POA: Diagnosis not present

## 2017-09-20 DIAGNOSIS — I480 Paroxysmal atrial fibrillation: Secondary | ICD-10-CM | POA: Diagnosis not present

## 2017-09-20 DIAGNOSIS — I25119 Atherosclerotic heart disease of native coronary artery with unspecified angina pectoris: Secondary | ICD-10-CM | POA: Diagnosis present

## 2017-09-20 DIAGNOSIS — R079 Chest pain, unspecified: Secondary | ICD-10-CM

## 2017-09-20 HISTORY — PX: LEFT HEART CATH AND CORONARY ANGIOGRAPHY: CATH118249

## 2017-09-20 SURGERY — LEFT HEART CATH AND CORONARY ANGIOGRAPHY
Anesthesia: Moderate Sedation | Laterality: Left

## 2017-09-20 SURGERY — LEFT HEART CATH AND CORONARY ANGIOGRAPHY
Anesthesia: Moderate Sedation

## 2017-09-20 MED ORDER — SODIUM CHLORIDE 0.9 % WEIGHT BASED INFUSION
3.0000 mL/kg/h | INTRAVENOUS | Status: AC
  Administered 2017-09-20: 3 mL/kg/h via INTRAVENOUS

## 2017-09-20 MED ORDER — SODIUM CHLORIDE 0.9 % WEIGHT BASED INFUSION: 1.0000 mL/kg/h | INTRAVENOUS | Status: DC

## 2017-09-20 MED ORDER — FENTANYL CITRATE (PF) 100 MCG/2ML IJ SOLN
INTRAMUSCULAR | Status: DC | PRN
  Administered 2017-09-20: 25 ug via INTRAVENOUS

## 2017-09-20 MED ORDER — MIDAZOLAM HCL 2 MG/2ML IJ SOLN
INTRAMUSCULAR | Status: DC | PRN
  Administered 2017-09-20: 1 mg via INTRAVENOUS

## 2017-09-20 MED ORDER — HEPARIN (PORCINE) IN NACL 2-0.9 UNIT/ML-% IJ SOLN
INTRAMUSCULAR | Status: AC
  Filled 2017-09-20: qty 1000

## 2017-09-20 MED ORDER — SODIUM CHLORIDE 0.9 % IV SOLN: 250.0000 mL | INTRAVENOUS | Status: DC | PRN

## 2017-09-20 MED ORDER — ASPIRIN 81 MG PO CHEW
CHEWABLE_TABLET | ORAL | Status: AC
  Administered 2017-09-20: 81 mg
  Filled 2017-09-20: qty 1

## 2017-09-20 MED ORDER — HEPARIN SODIUM (PORCINE) 1000 UNIT/ML IJ SOLN
INTRAMUSCULAR | Status: DC | PRN
  Administered 2017-09-20: 4000 [IU] via INTRAVENOUS

## 2017-09-20 MED ORDER — SODIUM CHLORIDE 0.9% FLUSH
3.0000 mL | INTRAVENOUS | Status: DC | PRN
Start: 2017-09-20 — End: 2017-09-20

## 2017-09-20 MED ORDER — VERAPAMIL HCL 2.5 MG/ML IV SOLN
INTRAVENOUS | Status: AC
  Filled 2017-09-20: qty 2

## 2017-09-20 MED ORDER — SODIUM CHLORIDE 0.9% FLUSH: 3.0000 mL | INTRAVENOUS | Status: DC | PRN

## 2017-09-20 MED ORDER — SODIUM CHLORIDE 0.9% FLUSH: 3.0000 mL | Freq: Two times a day (BID) | INTRAVENOUS | Status: DC

## 2017-09-20 MED ORDER — MIDAZOLAM HCL 2 MG/2ML IJ SOLN
INTRAMUSCULAR | Status: AC
  Filled 2017-09-20: qty 2

## 2017-09-20 MED ORDER — ONDANSETRON HCL 4 MG/2ML IJ SOLN: 4.0000 mg | Freq: Four times a day (QID) | INTRAMUSCULAR | Status: DC | PRN

## 2017-09-20 MED ORDER — IOPAMIDOL (ISOVUE-300) INJECTION 61%
INTRAVENOUS | Status: DC | PRN
  Administered 2017-09-20: 130 mL via INTRA_ARTERIAL

## 2017-09-20 MED ORDER — HEPARIN SODIUM (PORCINE) 1000 UNIT/ML IJ SOLN
INTRAMUSCULAR | Status: AC
  Filled 2017-09-20: qty 1

## 2017-09-20 MED ORDER — FENTANYL CITRATE (PF) 100 MCG/2ML IJ SOLN
INTRAMUSCULAR | Status: AC
  Filled 2017-09-20: qty 2

## 2017-09-20 MED ORDER — ASPIRIN 81 MG PO CHEW: 81.0000 mg | CHEWABLE_TABLET | Freq: Every day | ORAL | Status: DC

## 2017-09-20 MED ORDER — ACETAMINOPHEN 325 MG PO TABS: 650.0000 mg | ORAL_TABLET | ORAL | Status: DC | PRN

## 2017-09-20 MED ORDER — VERAPAMIL HCL 2.5 MG/ML IV SOLN
INTRAVENOUS | Status: DC | PRN
  Administered 2017-09-20: 2.5 mg via INTRAVENOUS

## 2017-09-20 MED ORDER — ASPIRIN 81 MG PO CHEW: 81.0000 mg | CHEWABLE_TABLET | ORAL | Status: DC

## 2017-09-20 SURGICAL SUPPLY — 11 items
CATH 5F 110X4 TIG (CATHETERS) ×3 IMPLANT
DEVICE RAD TR BAND REGULAR (VASCULAR PRODUCTS) ×3 IMPLANT
GLIDESHEATH SLEND A-KIT 6F 22G (SHEATH) ×3 IMPLANT
GUIDEWIRE .025 260CM (WIRE) ×3 IMPLANT
GUIDEWIRE INQWIRE 1.5J.035X260 (WIRE) ×1 IMPLANT
INQWIRE 1.5J .035X260CM (WIRE) ×3
KIT MANI 3VAL PERCEP (MISCELLANEOUS) ×3 IMPLANT
NEEDLE PERC 18GX7CM (NEEDLE) IMPLANT
PACK CARDIAC CATH (CUSTOM PROCEDURE TRAY) ×3 IMPLANT
SHEATH AVANTI 5FR X 11CM (SHEATH) IMPLANT
WIRE GUIDERIGHT .035X150 (WIRE) IMPLANT

## 2017-09-20 NOTE — Discharge Instructions (Signed)
Website with link to Afib videos and education for patients on [email or web address] Groin Insertion Instructions-If you lose feeling or develop tingling or pain in your leg or foot after the procedure, please walk around first.  If the discomfort does not improve , contact your physician and proceed to the nearest emergency room.  Loss of feeling in your leg might mean that a blockage has formed in the artery and this can be appropriately treated.  Limit your activity for the next two days after your procedure.  Avoid stooping, bending, heavy lifting or exertion as this may put pressure on the insertion site.  Resume normal activities in 48 hours.  You may shower after 24 hours but avoid excessive warm water and do not scrub the site.  Remove clear dressing in 48 hours.  If you have had a closure device inserted, do not soak in a tub bath or a hot tub for at least one week.  No driving for 48 hours after discharge.  After the procedure, check the insertion site occasionally.  If any oozing occurs or there is apparent swelling, firm pressure over the site will prevent a bruise from forming.  You can not hurt anything by pressing directly on the site.  The pressure stops the bleeding by allowing a small clot to form.  If the bleeding continues after the pressure has been applied for more than 15 minutes, call 911 or go to the nearest emergency room.    The x-ray dye causes you to pass a considerate amount of urine.  For this reason, you will be asked to drink plenty of liquids after the procedure to prevent dehydration.  You may resume you regular diet.  Avoid caffeine products.    For pain at the site of your procedure, take non-aspirin medicines such as Tylenol.  Medications: A. Hold Metformin for 48 hours if applicable.  B. Continue taking all your present medications at home unless your doctor prescribes any changes.  Moderate Conscious Sedation, Adult, Care After These instructions provide you  with information about caring for yourself after your procedure. Your health care provider may also give you more specific instructions. Your treatment has been planned according to current medical practices, but problems sometimes occur. Call your health care provider if you have any problems or questions after your procedure. What can I expect after the procedure? After your procedure, it is common:  To feel sleepy for several hours.  To feel clumsy and have poor balance for several hours.  To have poor judgment for several hours.  To vomit if you eat too soon.  Follow these instructions at home: For at least 24 hours after the procedure:   Do not: ? Participate in activities where you could fall or become injured. ? Drive. ? Use heavy machinery. ? Drink alcohol. ? Take sleeping pills or medicines that cause drowsiness. ? Make important decisions or sign legal documents. ? Take care of children on your own.  Rest. Eating and drinking  Follow the diet recommended by your health care provider.  If you vomit: ? Drink water, juice, or soup when you can drink without vomiting. ? Make sure you have little or no nausea before eating solid foods. General instructions  Have a responsible adult stay with you until you are awake and alert.  Take over-the-counter and prescription medicines only as told by your health care provider.  If you smoke, do not smoke without supervision.  Keep all follow-up visits as told  by your health care provider. This is important. Contact a health care provider if:  You keep feeling nauseous or you keep vomiting.  You feel light-headed.  You develop a rash.  You have a fever. Get help right away if:  You have trouble breathing. This information is not intended to replace advice given to you by your health care provider. Make sure you discuss any questions you have with your health care provider. Document Released: 04/16/2013 Document  Revised: 11/29/2015 Document Reviewed: 10/16/2015 Elsevier Interactive Patient Education  [employer].

## 2017-09-20 NOTE — Progress Notes (Signed)
Patient remains stable post heart cath. Denies complaints. Wife present. Discharge instructions given with questions answered.

## 2017-09-20 NOTE — Progress Notes (Signed)
Patient post cardiac cath per Dr Clayborn Bigness. Wife at bedside. Pt to have consult with [facility] for CABG. Vitals stable no bleeding at right radial site with TR band. Sinus rhythm per monitor. Dr Clayborn Bigness out to speak with wife with questions answered. Denies complaints.

## 2017-09-21 ENCOUNTER — Encounter: Payer: Self-pay | Admitting: Internal Medicine

## 2017-09-21 LAB — CARDIAC CATHETERIZATION: CATHEFQUANT: 60 %

## 2017-10-02 DIAGNOSIS — I2583 Coronary atherosclerosis due to lipid rich plaque: Secondary | ICD-10-CM

## 2017-10-02 DIAGNOSIS — I2510 Atherosclerotic heart disease of native coronary artery without angina pectoris: Secondary | ICD-10-CM | POA: Insufficient documentation

## 2017-10-03 DIAGNOSIS — I5032 Chronic diastolic (congestive) heart failure: Secondary | ICD-10-CM | POA: Insufficient documentation

## 2017-10-03 DIAGNOSIS — R768 Other specified abnormal immunological findings in serum: Secondary | ICD-10-CM | POA: Insufficient documentation

## 2017-10-04 DIAGNOSIS — G8918 Other acute postprocedural pain: Secondary | ICD-10-CM | POA: Insufficient documentation

## 2017-10-04 DIAGNOSIS — Z951 Presence of aortocoronary bypass graft: Secondary | ICD-10-CM | POA: Insufficient documentation

## 2017-10-05 DIAGNOSIS — Z7901 Long term (current) use of anticoagulants: Secondary | ICD-10-CM | POA: Insufficient documentation

## 2017-10-05 DIAGNOSIS — D62 Acute posthemorrhagic anemia: Secondary | ICD-10-CM | POA: Insufficient documentation

## 2017-10-06 DIAGNOSIS — N179 Acute kidney failure, unspecified: Secondary | ICD-10-CM | POA: Insufficient documentation

## 2017-10-06 DIAGNOSIS — D72829 Elevated white blood cell count, unspecified: Secondary | ICD-10-CM | POA: Insufficient documentation

## 2017-10-07 DIAGNOSIS — J948 Other specified pleural conditions: Secondary | ICD-10-CM | POA: Insufficient documentation

## 2017-10-09 DIAGNOSIS — I951 Orthostatic hypotension: Secondary | ICD-10-CM | POA: Insufficient documentation

## 2017-10-26 ENCOUNTER — Emergency Department
Admission: EM | Admit: 2017-10-26 | Discharge: 2017-10-26 | Disposition: A | Discharge status: Home / Self Care | Payer: Medicare Other | Attending: Emergency Medicine | Admitting: Emergency Medicine

## 2017-10-26 DIAGNOSIS — Z950 Presence of cardiac pacemaker: Secondary | ICD-10-CM | POA: Diagnosis not present

## 2017-10-26 DIAGNOSIS — R339 Retention of urine, unspecified: Secondary | ICD-10-CM

## 2017-10-26 DIAGNOSIS — Z85118 Personal history of other malignant neoplasm of bronchus and lung: Secondary | ICD-10-CM | POA: Diagnosis not present

## 2017-10-26 DIAGNOSIS — I10 Essential (primary) hypertension: Secondary | ICD-10-CM | POA: Diagnosis not present

## 2017-10-26 DIAGNOSIS — Z951 Presence of aortocoronary bypass graft: Secondary | ICD-10-CM | POA: Insufficient documentation

## 2017-10-26 DIAGNOSIS — I2510 Atherosclerotic heart disease of native coronary artery without angina pectoris: Secondary | ICD-10-CM | POA: Diagnosis not present

## 2017-10-26 DIAGNOSIS — Z79899 Other long term (current) drug therapy: Secondary | ICD-10-CM | POA: Insufficient documentation

## 2017-10-26 DIAGNOSIS — R1030 Lower abdominal pain, unspecified: Secondary | ICD-10-CM | POA: Insufficient documentation

## 2017-10-26 DIAGNOSIS — Z87891 Personal history of nicotine dependence: Secondary | ICD-10-CM | POA: Insufficient documentation

## 2017-10-26 DIAGNOSIS — Z7901 Long term (current) use of anticoagulants: Secondary | ICD-10-CM | POA: Insufficient documentation

## 2017-10-26 LAB — URINALYSIS, COMPLETE (UACMP) WITH MICROSCOPIC
BILIRUBIN URINE: NEGATIVE
GLUCOSE, UA: NEGATIVE mg/dL
Ketones, ur: NEGATIVE mg/dL
Leukocytes, UA: NEGATIVE
NITRITE: NEGATIVE
PH: 7 (ref 5.0–8.0)
Protein, ur: NEGATIVE mg/dL
SPECIFIC GRAVITY, URINE: 1.005 (ref 1.005–1.030)

## 2017-10-26 MED ORDER — LIDOCAINE HCL URETHRAL/MUCOSAL 2 % EX GEL
1.0000 "application " | Freq: Once | CUTANEOUS | Status: AC
  Administered 2017-10-26: 1 via URETHRAL

## 2017-10-26 MED ORDER — LIDOCAINE HCL URETHRAL/MUCOSAL 2 % EX GEL
CUTANEOUS | Status: AC
  Administered 2017-10-26: 1 via URETHRAL
  Filled 2017-10-26: qty 10

## 2017-10-26 NOTE — ED Notes (Signed)
Reviewed discharge instructions, follow-up care with patient. Patient verbalized understanding of all information reviewed. Patient stable, with no distress noted at this time.    

## 2017-10-26 NOTE — ED Triage Notes (Signed)
Patient coming from [facility] for urinary retention. Facility tried to in and out cath twice unsuccessfully due to enlarged prostate. Patient recently taken off of flomax by cardiology due to hypotensive episodes.

## 2017-10-26 NOTE — Discharge Instructions (Signed)
Please follow-up with your doctor or call the number provided for urology to arrange a follow-up appointment in 7 days for reevaluation and catheter removal.

## 2017-10-26 NOTE — ED Provider Notes (Signed)
[facility] Emergency Department Provider Note  Time seen: 7:42 PM  I have reviewed the triage vital signs and the nursing notes.   HISTORY  Chief Complaint No chief complaint on file.    HPI Wayne Marquez is a 77 y.o. male with a past medical history of CAD status post recent CABG currently at [facility] for rehabilitation, presents to the emergency department for difficulty urinating lower abdominal pain.  According to the patient he had his CABG performed approximately 2 weeks ago was taken off of his Flomax.  States he has noticed increased difficulty with urination over the past several days, today he has not been able to urinate since this morning at all.  Now states significant fullness and discomfort in his lower abdomen.  Denies any dysuria or hematuria.  Patient states a history of BPH in the past.  They attempted to catheterize the patient at [facility] but were unable to do so so they sent him to the emergency department for evaluation.  Largely negative review of systems.   Past Medical History:  Diagnosis Date  . Angina of effort ([place]) 01/20/2014  . Angina pectoris ([place]) 01/20/2014  . Anticoagulation goal of INR 2 to 3 04/30/2017   Overview:  coumadin; CHADSvasc=3 age, htn, cad  . 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 Atrial fibrillation ([place]) 01/20/2014  . 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 CAD (coronary artery disease), native coronary artery 01/20/2014  . 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   Overview:  Medtronic Advisa  . 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 Elevated prostate specific antigen (PSA) 01/24/2013  . Gastroesophageal reflux disease 07/17/2014  . History of biliary T-tube placement 03/04/2014   Overview:  Overview:  EF>55%, mild LVH, mod MR, mod TR, RVSP=55mmHg TTE 8/26./2015  . History of lung cancer 04/30/2017  . Hyperglyceridemia 01/20/2014  . Hypertension, benign 01/20/2014  .  Kidney stone 07/17/2014  . Nephrolithiasis 04/30/2017  . Paroxysmal atrial fibrillation (HCC) 07/17/2014   Overview:  Symptomatic, Drug Refractory; SSS with dual chamber pacemaker in situ  Overview:  Overview:  Symptomatic, Drug Refractory; SSS with dual chamber pacemaker in situ  . Personal history of malignant neoplasm of bronchus and lung 01/20/2014  . Pre-syncope 07/17/2014  . Presence of cardiac pacemaker 06/15/2014   Overview:  Overview:  Medtronic Advisa  . Pulmonary hypertension ([place]) 07/17/2014   Overview:  RVSP=24mmHG by TTE 03/04/2014  Overview:  Overview:  RVSP=91mmHG by TTE 03/04/2014  . 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 Seasonal allergic rhinitis 07/17/2014   Overview:  Overview:  allergic rhinitis  . SI (sacroiliac) joint dysfunction 08/10/2014  . Sick sinus syndrome ([place]) 07/17/2014   Overview:  Overview:  sinoatrial node dysfunction with dual chamber pacemaker in situ; paroxysmal AFib  . Sinoatrial node dysfunction (HCC) 01/20/2014  . Syncope and collapse 01/20/2014    Patient Active Problem List   Diagnosis Date Noted  . Anticoagulation goal of INR 2 to 3 04/30/2017  . BPH (benign prostatic hyperplasia) 04/30/2017  . GERD (gastroesophageal reflux disease) 04/30/2017  . High risk medication use 04/30/2017  . History of lung cancer 04/30/2017  . Nephrolithiasis 04/30/2017  . Other and unspecified angina pectoris 04/30/2017  . Seasonal allergies 04/30/2017  . SSS (sick sinus syndrome) ([place]) 04/30/2017  . Syncope, near 04/30/2017  . 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 SI (sacroiliac) joint dysfunction 08/10/2014  . 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 Kidney  stone 07/17/2014  . Encounter for therapeutic drug monitoring 07/17/2014  . Fitting or adjustment of cardiac pacemaker 07/17/2014  . Gastroesophageal reflux disease 07/17/2014  . History of malignant neoplasm 07/17/2014  . Hypertriglyceridemia 07/17/2014  . Paroxysmal atrial  fibrillation ([place]) 07/17/2014  . Polypharmacy 07/17/2014  . Pre-syncope 07/17/2014  . Pulmonary hypertension ([place]) 07/17/2014  . 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 Seasonal allergic rhinitis 07/17/2014  . Sick sinus syndrome ([place]) 07/17/2014  . 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  . Presence of cardiac pacemaker 06/15/2014  . H/O echocardiogram 03/04/2014  . History of biliary T-tube placement 03/04/2014  . Angina of effort ([place]) 01/20/2014  . Angina pectoris ([place]) 01/20/2014  . Atrial fibrillation ([place]) 01/20/2014  . Neck pain 01/20/2014  . CAD (coronary artery disease), native coronary artery 01/20/2014  . Esophageal reflux 01/20/2014  . Hyperglyceridemia 01/20/2014  . Hypertension, benign 01/20/2014  . Personal history of malignant neoplasm of bronchus and lung 01/20/2014  . Sinoatrial node dysfunction (HCC) 01/20/2014  . Syncope and collapse 01/20/2014  . 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 Elevated prostate specific antigen (PSA) 01/24/2013    Past Surgical History:  Procedure Laterality Date  . CARDIAC ELECTROPHYSIOLOGY MAPPING AND ABLATION    . LEFT HEART CATH AND CORONARY ANGIOGRAPHY Left 09/20/2017   Procedure: LEFT HEART CATH AND CORONARY ANGIOGRAPHY;  Surgeon: Yolonda Kida, MD;  Location: [facility];  Service: Cardiovascular;  Laterality: Left;  . PACEMAKER IMPLANT  2014    Prior to Admission medications   Medication Sig Start Date End Date Taking? Authorizing Provider  amitriptyline (ELAVIL) 50 MG tablet Take 50 mg by mouth at bedtime.    [provider]  ELIQUIS 5 MG TABS tablet Take 5 mg by mouth 2 (two) times daily.  04/16/17   [provider]  finasteride (PROSCAR) 5 MG tablet Take 1 tablet (5 mg total) by mouth daily. Patient taking differently: Take 5 mg by mouth at bedtime.  04/30/17   Stoioff, Ronda Fairly, MD  losartan (COZAAR) 25 MG tablet Take 25 mg by mouth 2 (two) times daily.  02/19/17   [provider]  omeprazole  (PRILOSEC) 20 MG capsule Take 20 mg by mouth at bedtime.  03/05/17   [provider]  simvastatin (ZOCOR) 40 MG tablet Take 40 mg by mouth daily.  03/26/17   [provider]  sotalol (BETAPACE) 120 MG tablet Take 120 mg by mouth 2 (two) times daily.  03/05/17   [provider]  tamsulosin (FLOMAX) 0.4 MG CAPS capsule Take 1 capsule (0.4 mg total) by mouth daily. 04/30/17   Stoioff, Ronda Fairly, MD    Allergies  Allergen Reactions  . Sulfa Antibiotics Other (See Comments)    GI UPSET    Family History  Problem Relation Age of Onset  . Bladder Cancer Neg Hx   . Kidney cancer Neg Hx   . Prostate cancer Neg Hx     Social History Social History   Tobacco Use  . Smoking status: Former Research scientist (life sciences)  . Smokeless tobacco: Former Network engineer Use Topics  . Alcohol use: No  . Drug use: No    Review of Systems Constitutional: Negative for fever. Eyes: Negative for visual complaints ENT: Negative for recent illness Cardiovascular: Chest pain is very mild and improving since CABG 2 weeks ago. Respiratory: Negative for shortness of breath. Gastrointestinal: Positive for lower abdominal pain and fullness.  Negative for nausea or vomiting Genitourinary: Unable to urinate since this morning, significant lower abdominal discomfort  Musculoskeletal: Negative for musculoskeletal complaints Skin: Negative for skin complaints  Neurological: Negative for headache All other ROS negative  ____________________________________________   PHYSICAL EXAM:  Constitutional: Alert and oriented. Well appearing and in no distress. Eyes: Normal exam ENT   Head: Normocephalic and atraumatic   Mouth/Throat: Mucous membranes are moist. Cardiovascular: Normal rate, regular rhythm. No murmur Respiratory: Normal respiratory effort without tachypnea nor retractions. Breath sounds are clear.  CABG incision to chest well-appearing, staples intact no dehiscence or signs of  infection. Gastrointestinal: Lower abdominal fullness, significant lower abdominal tenderness. Musculoskeletal: Nontender with normal range of motion in all extremities. No lower extremity tenderness or edema. Neurologic:  Normal speech and language. No gross focal neurologic deficits Skin:  Skin is warm, dry and intact.  Psychiatric: Mood and affect are normal.   ____________________________________________   INITIAL IMPRESSION / ASSESSMENT AND PLAN / ED COURSE  Pertinent labs & imaging results that were available during my care of the patient were reviewed by me and considered in my medical decision making (see chart for details).  Patient presents to the emergency department unable to urinate with significant lower abdominal discomfort and fullness on examination.  Differential includes urinary tract infection, urinary retention, BPH, hematuria.  Catheter place given significant fullness and discomfort on examination.  Per nurse catheter insertion went very smoothly.  Patient states immediate relief from his discomfort continues to have significant output from the catheter.  We will send a urinalysis as well as urine culture and continue to closely monitor the patient.  ----------------------------------------- 8:29 PM on 10/26/2017 -----------------------------------------  Urinalysis is negative.  Urine culture has been sent.  We will discharge the patient with a leg bag back to his rehab facility.  Patient agreeable to this plan of care.  ____________________________________________   FINAL CLINICAL IMPRESSION(S) / ED DIAGNOSES  Acute urinary retention    Harvest Dark, MD 10/26/17 2030

## 2017-10-28 LAB — URINE CULTURE: CULTURE: NO GROWTH

## 2018-04-29 ENCOUNTER — Encounter: Payer: Self-pay | Admitting: Urology

## 2018-04-29 ENCOUNTER — Ambulatory Visit: Payer: Medicare Other | Admitting: Urology

## 2018-05-22 ENCOUNTER — Ambulatory Visit (INDEPENDENT_AMBULATORY_CARE_PROVIDER_SITE_OTHER): Payer: Medicare Other | Admitting: Urology

## 2018-05-22 ENCOUNTER — Encounter: Payer: Self-pay | Admitting: Urology

## 2018-05-22 VITALS — BP 148/82 | HR 83 | Ht 67.0 in | Wt 158.7 lb

## 2018-05-22 DIAGNOSIS — R972 Elevated prostate specific antigen [PSA]: Secondary | ICD-10-CM

## 2018-05-22 DIAGNOSIS — N411 Chronic prostatitis: Secondary | ICD-10-CM | POA: Diagnosis not present

## 2018-05-22 DIAGNOSIS — N401 Enlarged prostate with lower urinary tract symptoms: Secondary | ICD-10-CM | POA: Diagnosis not present

## 2018-05-22 NOTE — Progress Notes (Signed)
05/22/2018 10:27 AM   Wayne Marquez 06-02-1941 [id number]  Referring provider: Renee Rival, NP [address], [place] [address]  Chief Complaint  Patient presents with  . Benign Prostatic Hypertrophy   Urologic history: 1.  Chronic prostatitis/chronic pelvic pain syndrome -Diagnosed mid 1990s; well managed on amitriptyline  2.  BPH with lower urinary tract symptoms -Stable on combination therapy tamsulosin/finasteride -Cystoscopy 02/2017 coapting lateral lobes and moderate intravesical median lobe  3.  Elevated PSA -Prostate biopsy 2002 PSA 6.3 with benign pathology  -Uncorrected PSA has been stable and he elected to discontinue PSA testing in 7432   HPI: 77 year old male presents for annual follow-up.  He did have a three-vessel CABG at [facility] in April 2019 and was seen in the ED here for an episode of urinary retention.  He had a successful voiding trial and is currently back to baseline.  He remains on tamsulosin/finasteride.  Denies dysuria or gross hematuria.  Denies flank, abdominal, pelvic or scrotal pain.  IPSS completed today was 10/35 with a quality of life rated 2/6.   PMH: Past Medical History:  Diagnosis Date  . Angina of effort ([place]) 01/20/2014  . Angina pectoris ([place]) 01/20/2014  . Anticoagulation goal of INR 2 to 3 04/30/2017   Overview:  coumadin; CHADSvasc=3 age, htn, cad  . 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 Atrial fibrillation ([place]) 01/20/2014  . 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 CAD (coronary artery disease), native coronary artery 01/20/2014  . 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   Overview:  Medtronic Advisa  . 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 Elevated prostate specific antigen (PSA) 01/24/2013  . Gastroesophageal reflux disease 07/17/2014  . History of biliary T-tube placement 03/04/2014   Overview:  Overview:  EF>55%, mild LVH, mod MR, mod TR, RVSP=78mmHg TTE 8/26./2015  . History of lung cancer  04/30/2017  . Hyperglyceridemia 01/20/2014  . Hypertension, benign 01/20/2014  . Kidney stone 07/17/2014  . Nephrolithiasis 04/30/2017  . Paroxysmal atrial fibrillation (HCC) 07/17/2014   Overview:  Symptomatic, Drug Refractory; SSS with dual chamber pacemaker in situ  Overview:  Overview:  Symptomatic, Drug Refractory; SSS with dual chamber pacemaker in situ  . Personal history of malignant neoplasm of bronchus and lung 01/20/2014  . Pre-syncope 07/17/2014  . Presence of cardiac pacemaker 06/15/2014   Overview:  Overview:  Medtronic Advisa  . Pulmonary hypertension ([place]) 07/17/2014   Overview:  RVSP=85mmHG by TTE 03/04/2014  Overview:  Overview:  RVSP=60mmHG by TTE 03/04/2014  . 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 Seasonal allergic rhinitis 07/17/2014   Overview:  Overview:  allergic rhinitis  . SI (sacroiliac) joint dysfunction 08/10/2014  . Sick sinus syndrome ([place]) 07/17/2014   Overview:  Overview:  sinoatrial node dysfunction with dual chamber pacemaker in situ; paroxysmal AFib  . Sinoatrial node dysfunction (HCC) 01/20/2014  . Syncope and collapse 01/20/2014    Surgical History: Past Surgical History:  Procedure Laterality Date  . CARDIAC ELECTROPHYSIOLOGY MAPPING AND ABLATION    . LEFT HEART CATH AND CORONARY ANGIOGRAPHY Left 09/20/2017   Procedure: LEFT HEART CATH AND CORONARY ANGIOGRAPHY;  Surgeon: Yolonda Kida, MD;  Location: [facility];  Service: Cardiovascular;  Laterality: Left;  . PACEMAKER IMPLANT  2014    Home Medications:  Allergies as of 05/22/2018      Reactions   Sulfa Antibiotics Other (See Comments)   GI UPSET      Medication List        Accurate  as of 05/22/18 10:27 AM. Always use your most recent med list.          acetaminophen 325 MG tablet Commonly known as:  TYLENOL Take by mouth.   amitriptyline 25 MG tablet Commonly known as:  ELAVIL   ELIQUIS 5 MG Tabs tablet Generic drug:  apixaban Take 5 mg by mouth 2 (two) times daily.     finasteride 5 MG tablet Commonly known as:  PROSCAR Take 1 tablet (5 mg total) by mouth daily.   losartan 25 MG tablet Commonly known as:  COZAAR Take 25 mg by mouth 2 (two) times daily.   magnesium oxide 400 MG tablet Commonly known as:  MAG-OX Take by mouth.   metoprolol tartrate 25 MG tablet Commonly known as:  LOPRESSOR Take by mouth.   midodrine 10 MG tablet Commonly known as:  PROAMATINE Take by mouth.   omeprazole 20 MG capsule Commonly known as:  PRILOSEC Take 20 mg by mouth at bedtime.   rosuvastatin 20 MG tablet Commonly known as:  CRESTOR   simvastatin 40 MG tablet Commonly known as:  ZOCOR Take 40 mg by mouth daily.   sotalol 120 MG tablet Commonly known as:  BETAPACE Take 120 mg by mouth 2 (two) times daily.   tamsulosin 0.4 MG Caps capsule Commonly known as:  FLOMAX Take 1 capsule (0.4 mg total) by mouth daily.       Allergies:  Allergies  Allergen Reactions  . Sulfa Antibiotics Other (See Comments)    GI UPSET    Family History: Family History  Problem Relation Age of Onset  . Bladder Cancer Neg Hx   . Kidney cancer Neg Hx   . Prostate cancer Neg Hx     Social History:  reports that he has quit smoking. He has quit using smokeless tobacco. He reports that he does not drink alcohol or use drugs.  ROS: Review of system as per today's intake sheet  Physical Exam: BP (!) 148/82 (BP Location: Left Arm, Patient Position: Sitting, Cuff Size: Normal)   Pulse 83   Ht 5\' 7"  (1.702 m)   Wt 158 lb 11.2 oz (72 kg)   BMI 24.86 kg/m   Constitutional:  Alert and oriented, No acute distress. HEENT: [place] AT, moist mucus membranes.  Trachea midline, no masses. Cardiovascular: No clubbing, cyanosis, or edema. Respiratory: Normal respiratory effort, no increased work of breathing. GI: Abdomen is soft, nontender, nondistended, no abdominal masses GU: No CVA tenderness.  Prostate 50 g, smooth without nodules Lymph: No cervical or inguinal  lymphadenopathy. Skin: No rashes, bruises or suspicious lesions. Neurologic: Grossly intact, no focal deficits, moving all 4 extremities. Psychiatric: Normal mood and affect.   Assessment & Plan:    1. Benign prostatic hyperplasia with lower urinary tract symptoms, symptom details unspecified Stable lower urinary tract symptoms on combination therapy.  Finasteride/tamsulosin were refilled.  PVR by bladder scan today was  - Bladder Scan (Post Void Residual) in office  2. Chronic prostatitis Stable, amitriptyline was refilled   3. Elevated prostate specific antigen (PSA) Benign DRE.  He elected to discontinue PSA testing last year  Return in about 1 year (around 05/23/2019) for Recheck.   Abbie Sons, [place] [address] [place], [place] [address] [phone]

## 2018-05-23 ENCOUNTER — Encounter: Payer: Self-pay | Admitting: Urology

## 2018-05-23 MED ORDER — AMITRIPTYLINE HCL 50 MG PO TABS: 50.0000 mg | ORAL_TABLET | Freq: Every day | ORAL | 3 refills | Status: DC

## 2018-05-23 MED ORDER — FINASTERIDE 5 MG PO TABS: 5.0000 mg | ORAL_TABLET | Freq: Every day | ORAL | 3 refills | Status: DC

## 2018-05-23 MED ORDER — TAMSULOSIN HCL 0.4 MG PO CAPS: 0.4000 mg | ORAL_CAPSULE | Freq: Every day | ORAL | 3 refills | Status: DC

## 2018-11-24 IMAGING — CT CT ANGIO CHEST
4 of 7 series · 18 of 46 positions shown · IV contrast (APPLIED)
Comparison: 08/28/2009

CLINICAL DATA: Chest pain and shortness of breath, sudden onset.
Diaphoresis.

EXAM:
CT ANGIOGRAPHY CHEST WITH CONTRAST
TECHNIQUE: Multidetector CT imaging of the chest was performed using the
standard protocol during bolus administration of intravenous
contrast. Multiplanar CT image reconstructions and MIPs were
obtained to evaluate the vascular anatomy.
CONTRAST:  75mL LZCJPW-Z1Q IOPAMIDOL (LZCJPW-Z1Q) INJECTION 76%

[Series 3: axial pre · axial · non-contrast · 0.64mm/px · z∈[-882,-652]mm · 5 of 70 slices shown]
[im 12/70  lung]
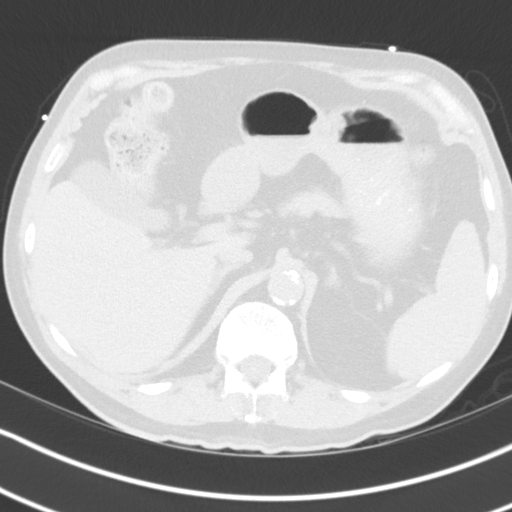
[im 24/70  lung]
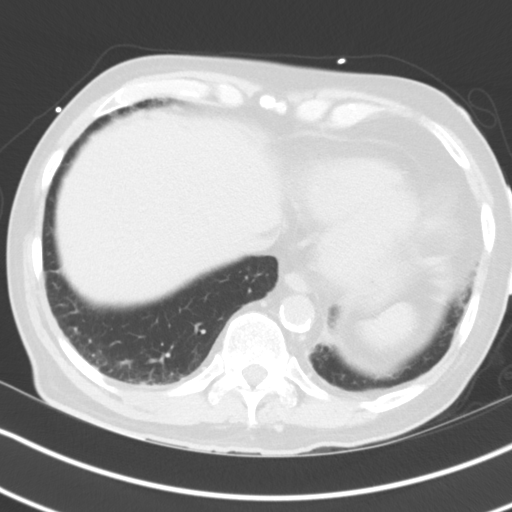
[im 35/70  lung]
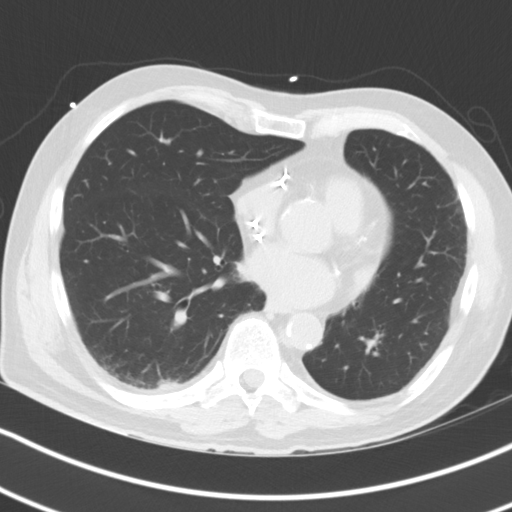
[im 47/70  lung]
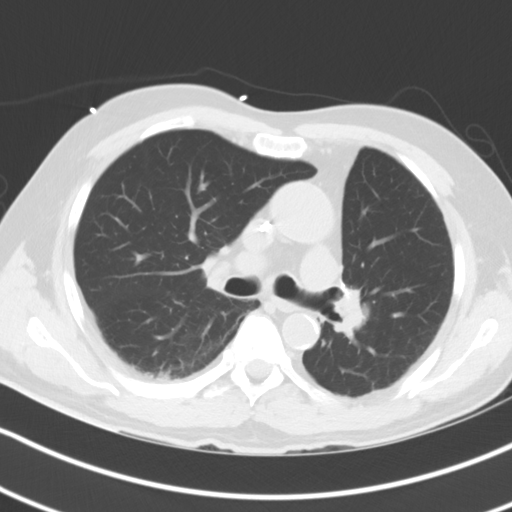
[im 58/70  lung]
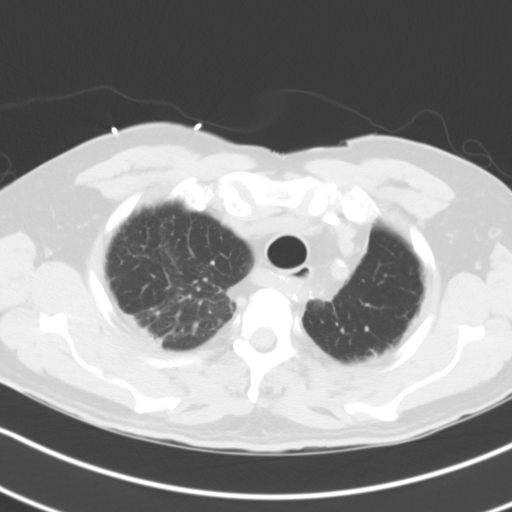

[Series 6: axial arterial · axial · arterial · 0.64mm/px · z∈[-902,-632]mm · 8 of 116 slices shown]
[im 13/116  lung]
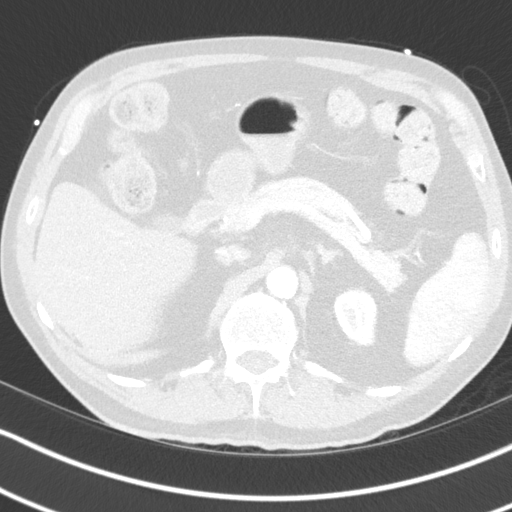
[im 26/116  soft-tissue]
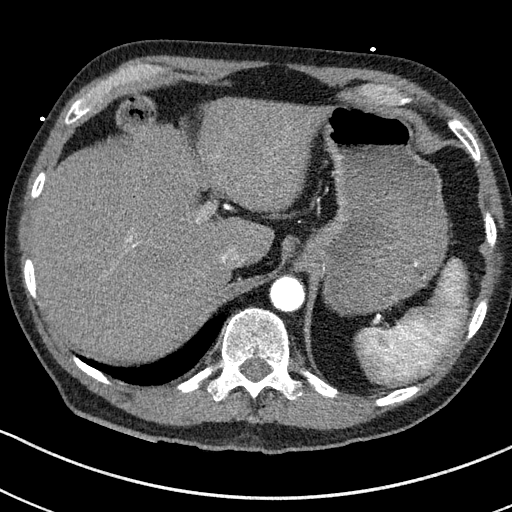
[im 39/116  lung]
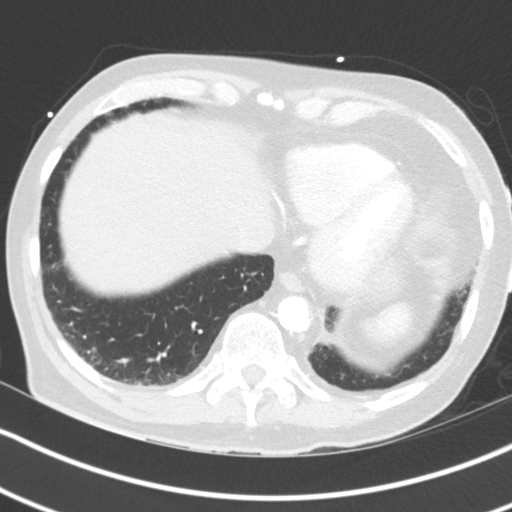
[im 52/116  soft-tissue]
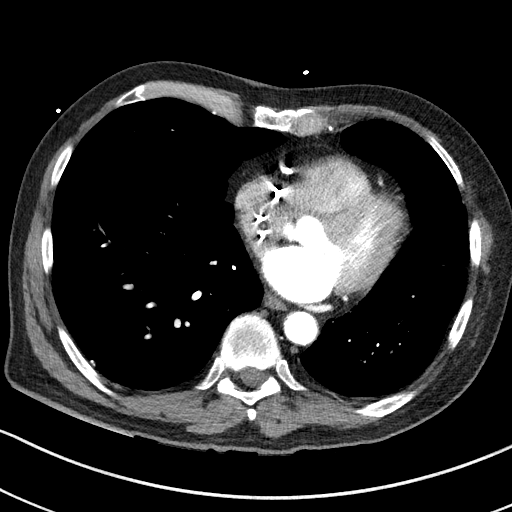
[im 64/116  lung]
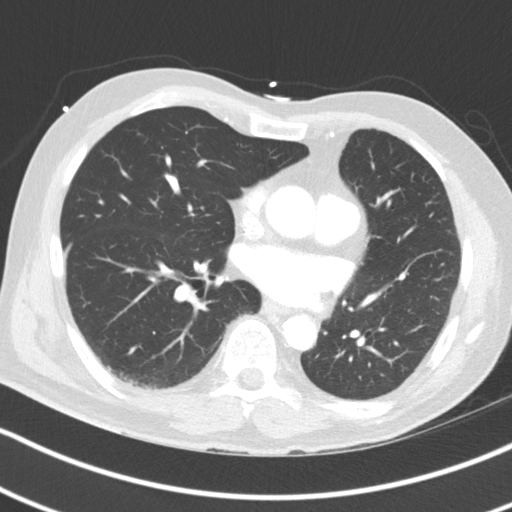
[im 77/116  soft-tissue]
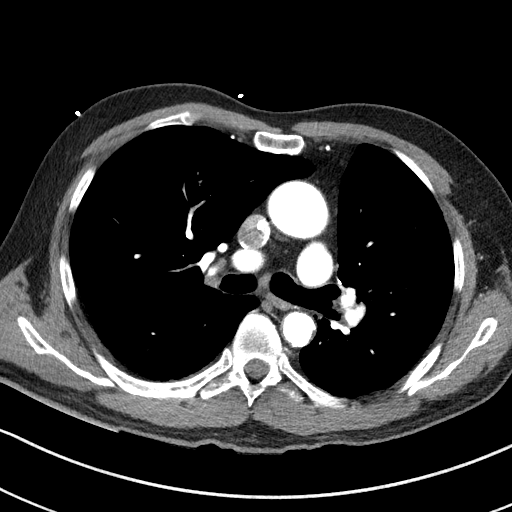
[im 90/116  lung]
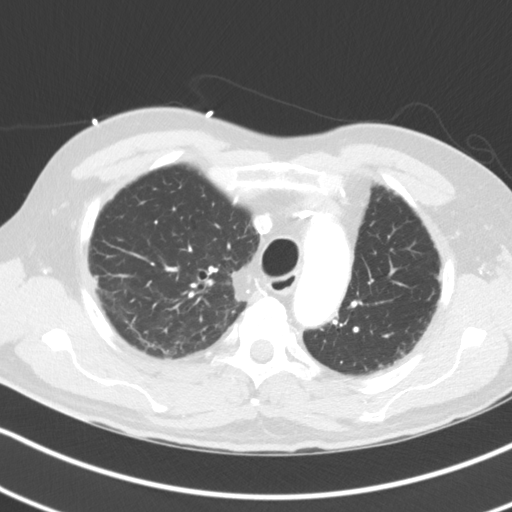
[im 103/116  soft-tissue]
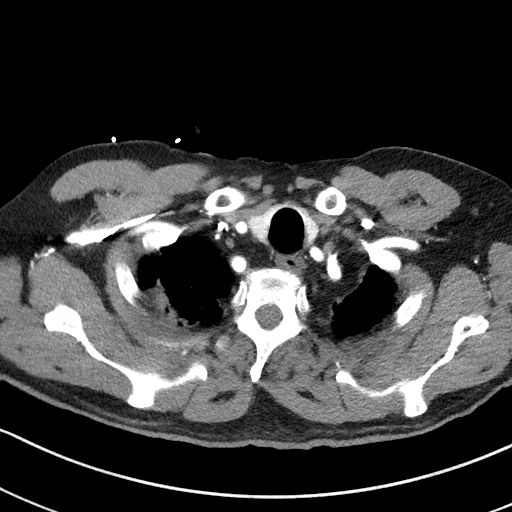

[Series 7: lung · axial · 0.64mm/px · z∈[-914,-864]mm · 2 of 174 slices shown]
[im 13/174  soft-tissue]
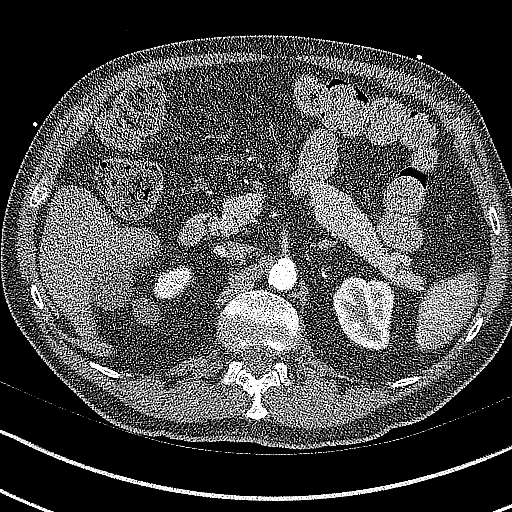
[im 38/174  soft-tissue]
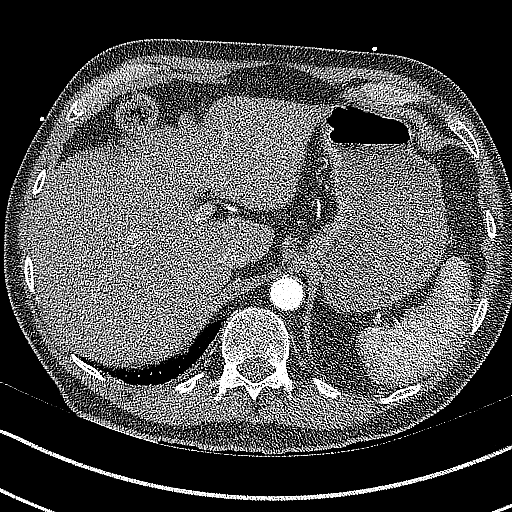

[Series 8: coronals · coronal · 0.65mm/px · 3 of 133 slices shown]
[im 34/133  soft-tissue]
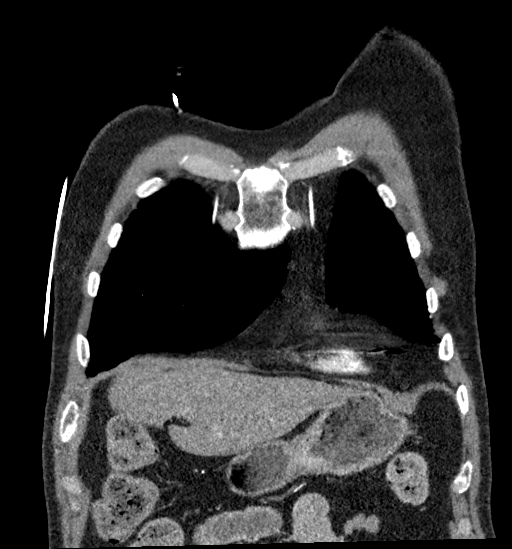
[im 67/133  soft-tissue]
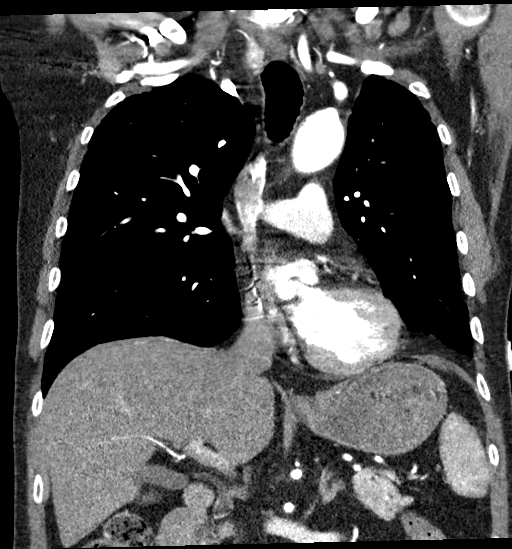
[im 100/133  soft-tissue]
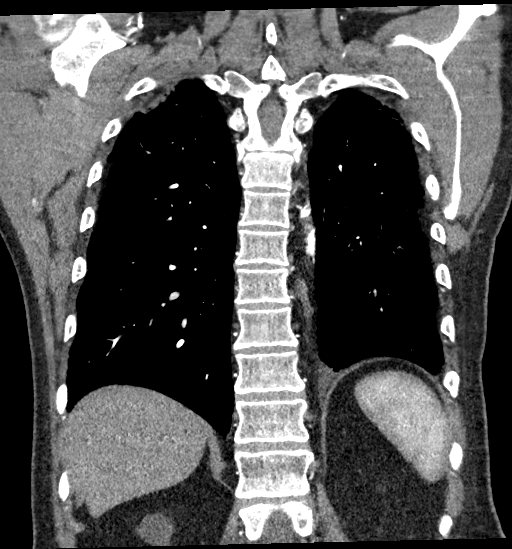

[18 of 46 positions shown; findings below may reference images not displayed]

FINDINGS: Cardiovascular: Cardiac pacemaker. Normal heart size. No pericardial
effusion. Coronary artery calcification diffusely. A ectatic
ascending thoracic aorta, measuring 3 point 6 cm in diameter.
Calcification of the aorta. No intramural hematoma. No aortic
dissection. Great vessel origins are patent. Aberrant right
subclavian artery.

Mediastinum/Nodes: No significant lymphadenopathy in the chest.
Esophagus is decompressed. Subcentimeter right thyroid nodule is
unchanged since previous study. No follow-up is indicated.

Lungs/Pleura: Calcified granuloma in the right lower lung. Slight
fibrosis in both lung bases peripheral eat. Bilateral apical pleural
scarring, greater on the right and without change since previous
study. No consolidation or airspace disease. No pleural effusions.
No pneumothorax. Airways are patent.

Upper Abdomen: Mild diffuse fatty infiltration of the liver.
Bilateral renal cysts.

Musculoskeletal: No chest wall abnormality. No acute or significant
osseous findings.

Review of the MIP images confirms the above findings.
IMPRESSION: 1. No evidence of aortic aneurysm or dissection. Aberrant right
subclavian artery. Aortic atherosclerosis.
2. Chronic scarring in the lungs. No evidence of active pulmonary
disease.
3. Mild diffuse fatty infiltration of the liver. Bilateral renal
cysts.

Aortic Atherosclerosis ([id number]-GN3.3).

## 2019-05-23 ENCOUNTER — Other Ambulatory Visit: Payer: Self-pay | Admitting: Family Medicine

## 2019-05-23 ENCOUNTER — Other Ambulatory Visit (HOSPITAL_COMMUNITY): Payer: Self-pay | Admitting: Family Medicine

## 2019-05-23 DIAGNOSIS — M5412 Radiculopathy, cervical region: Secondary | ICD-10-CM

## 2019-05-23 DIAGNOSIS — M5030 Other cervical disc degeneration, unspecified cervical region: Secondary | ICD-10-CM

## 2019-05-26 ENCOUNTER — Ambulatory Visit: Payer: Medicare Other | Admitting: Urology

## 2019-06-06 ENCOUNTER — Ambulatory Visit (HOSPITAL_COMMUNITY)
Admission: RE | Admit: 2019-06-06 | Discharge: 2019-06-06 | Disposition: A | Discharge status: Home / Self Care | Payer: Medicare Other | Source: Ambulatory Visit | Attending: Family Medicine | Admitting: Family Medicine

## 2019-06-06 ENCOUNTER — Other Ambulatory Visit: Payer: Self-pay

## 2019-06-06 DIAGNOSIS — M5412 Radiculopathy, cervical region: Secondary | ICD-10-CM

## 2019-06-06 DIAGNOSIS — M5030 Other cervical disc degeneration, unspecified cervical region: Secondary | ICD-10-CM | POA: Insufficient documentation

## 2019-06-13 ENCOUNTER — Ambulatory Visit (INDEPENDENT_AMBULATORY_CARE_PROVIDER_SITE_OTHER): Payer: Medicare Other | Admitting: Urology

## 2019-06-13 ENCOUNTER — Encounter: Payer: Self-pay | Admitting: Urology

## 2019-06-13 ENCOUNTER — Other Ambulatory Visit: Payer: Self-pay

## 2019-06-13 VITALS — BP 139/84 | HR 79 | Ht 67.0 in | Wt 156.0 lb

## 2019-06-13 DIAGNOSIS — N401 Enlarged prostate with lower urinary tract symptoms: Secondary | ICD-10-CM

## 2019-06-13 DIAGNOSIS — N411 Chronic prostatitis: Secondary | ICD-10-CM | POA: Diagnosis not present

## 2019-06-13 DIAGNOSIS — N138 Other obstructive and reflux uropathy: Secondary | ICD-10-CM | POA: Diagnosis not present

## 2019-06-13 MED ORDER — FINASTERIDE 5 MG PO TABS: 5.0000 mg | ORAL_TABLET | Freq: Every day | ORAL | 3 refills | Status: DC

## 2019-06-13 MED ORDER — TAMSULOSIN HCL 0.4 MG PO CAPS: 0.4000 mg | ORAL_CAPSULE | Freq: Every day | ORAL | 3 refills | Status: DC

## 2019-06-13 MED ORDER — AMITRIPTYLINE HCL 25 MG PO TABS: 25.0000 mg | ORAL_TABLET | Freq: Every day | ORAL | 3 refills | Status: DC

## 2019-06-13 NOTE — Progress Notes (Signed)
06/13/2019 9:12 AM   Wayne Marquez [birth year] [id number]  Referring provider: Renee Rival, NP [place],  [place] [address]  Chief Complaint  Patient presents with  . Benign Prostatic Hypertrophy  . Follow-up    Urologic history: 1.  Chronic prostatitis/chronic pelvic pain syndrome -Diagnosed mid 1990s; well managed on amitriptyline  2.  BPH with lower urinary tract symptoms -Stable on combination therapy tamsulosin/finasteride -Cystoscopy 02/2017 coapting lateral lobes and moderate intravesical median lobe  3.  Elevated PSA -Prostate biopsy 2002 PSA 6.3 with benign pathology  -Uncorrected PSA has been stable and he elected to discontinue PSA testing in 2018   HPI: 78 y.o. male presents for annual follow-up.  He denies urologic issues in the last 12 months.  Remains on tamsulosin/finasteride with stable lower urinary tract symptoms.  He remains on amitriptyline and has had no recurrent chronic prostatitis symptoms.  Denies dysuria or gross hematuria.  No flank, abdominal or pelvic pain.   PMH: Past Medical History:  Diagnosis Date  . Angina of effort ([place]) 01/20/2014  . Angina pectoris ([place]) 01/20/2014  . Anticoagulation goal of INR 2 to 3 04/30/2017   Overview:  coumadin; CHADSvasc=3 age, htn, cad  . 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 Atrial fibrillation ([place]) 01/20/2014  . 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 CAD (coronary artery disease), native coronary artery 01/20/2014  . 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   Overview:  Medtronic Advisa  . 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 Elevated prostate specific antigen (PSA) 01/24/2013  . Gastroesophageal reflux disease 07/17/2014  . History of biliary T-tube placement 03/04/2014   Overview:  Overview:  EF>55%, mild LVH, mod MR, mod TR, RVSP=13mmHg TTE 8/26./2015  . History of lung cancer 04/30/2017  . Hyperglyceridemia 01/20/2014  . Hypertension, benign 01/20/2014  .  Kidney stone 07/17/2014  . Nephrolithiasis 04/30/2017  . Paroxysmal atrial fibrillation (HCC) 07/17/2014   Overview:  Symptomatic, Drug Refractory; SSS with dual chamber pacemaker in situ  Overview:  Overview:  Symptomatic, Drug Refractory; SSS with dual chamber pacemaker in situ  . Personal history of malignant neoplasm of bronchus and lung 01/20/2014  . Pre-syncope 07/17/2014  . Presence of cardiac pacemaker 06/15/2014   Overview:  Overview:  Medtronic Advisa  . Pulmonary hypertension ([place]) 07/17/2014   Overview:  RVSP=29mmHG by TTE 03/04/2014  Overview:  Overview:  RVSP=73mmHG by TTE 03/04/2014  . 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 Seasonal allergic rhinitis 07/17/2014   Overview:  Overview:  allergic rhinitis  . SI (sacroiliac) joint dysfunction 08/10/2014  . Sick sinus syndrome ([place]) 07/17/2014   Overview:  Overview:  sinoatrial node dysfunction with dual chamber pacemaker in situ; paroxysmal AFib  . Sinoatrial node dysfunction (HCC) 01/20/2014  . Syncope and collapse 01/20/2014    Surgical History: Past Surgical History:  Procedure Laterality Date  . CARDIAC ELECTROPHYSIOLOGY MAPPING AND ABLATION    . LEFT HEART CATH AND CORONARY ANGIOGRAPHY Left 09/20/2017   Procedure: LEFT HEART CATH AND CORONARY ANGIOGRAPHY;  Surgeon: Yolonda Kida, MD;  Location: [facility];  Service: Cardiovascular;  Laterality: Left;  . PACEMAKER IMPLANT  2014    Home Medications:  Allergies as of 06/13/2019      Reactions   Sulfa Antibiotics Other (See Comments)   GI UPSET      Medication List       Accurate as of June 13, 2019  9:12 AM. If you have any questions, ask your nurse or doctor.  STOP taking these medications   Eliquis 5 MG Tabs tablet Generic drug: apixaban Stopped by: Abbie Sons, MD     TAKE these medications   acetaminophen 325 MG tablet Commonly known as: TYLENOL Take by mouth.   amitriptyline 50 MG tablet Commonly known as: ELAVIL Take 1 tablet (50 mg  total) by mouth at bedtime.   finasteride 5 MG tablet Commonly known as: PROSCAR Take 1 tablet (5 mg total) by mouth daily.   losartan 25 MG tablet Commonly known as: COZAAR Take 25 mg by mouth 2 (two) times daily.   metoprolol tartrate 25 MG tablet Commonly known as: LOPRESSOR Take by mouth.   omeprazole 20 MG capsule Commonly known as: PRILOSEC Take 20 mg by mouth at bedtime.   rosuvastatin 20 MG tablet Commonly known as: CRESTOR   simvastatin 40 MG tablet Commonly known as: ZOCOR Take 40 mg by mouth daily.   sotalol 120 MG tablet Commonly known as: BETAPACE Take 120 mg by mouth 2 (two) times daily.   tamsulosin 0.4 MG Caps capsule Commonly known as: FLOMAX Take 1 capsule (0.4 mg total) by mouth daily.   warfarin 5 MG tablet Commonly known as: COUMADIN Take by mouth.       Allergies:  Allergies  Allergen Reactions  . Sulfa Antibiotics Other (See Comments)    GI UPSET    Family History: Family History  Problem Relation Age of Onset  . Bladder Cancer Neg Hx   . Kidney cancer Neg Hx   . Prostate cancer Neg Hx     Social History:  reports that he has quit smoking. He has quit using smokeless tobacco. He reports that he does not drink alcohol or use drugs.  ROS: UROLOGY Frequent Urination?: No Hard to postpone urination?: No Burning/pain with urination?: No Get up at night to urinate?: Yes Leakage of urine?: No Urine stream starts and stops?: No Trouble starting stream?: No Do you have to strain to urinate?: No Blood in urine?: No Urinary tract infection?: No Sexually transmitted disease?: No Injury to kidneys or bladder?: No Painful intercourse?: No Weak stream?: No Erection problems?: No Penile pain?: No  Gastrointestinal Nausea?: No Vomiting?: No Indigestion/heartburn?: No Diarrhea?: No Constipation?: No  Constitutional Fever: No Night sweats?: No Weight loss?: No Fatigue?: No  Skin Skin rash/lesions?: No Itching?: No  Eyes  Blurred vision?: No Double vision?: No  Ears/Nose/Throat Sore throat?: No Sinus problems?: No  Hematologic/Lymphatic Swollen glands?: No Easy bruising?: No  Cardiovascular Leg swelling?: No Chest pain?: No  Respiratory Cough?: No Shortness of breath?: No  Endocrine Excessive thirst?: No  Musculoskeletal Back pain?: No Joint pain?: No  Neurological Headaches?: No Dizziness?: No  Psychologic Depression?: No Anxiety?: No  Physical Exam: BP 139/84   Pulse 79   Ht 5\' 7"  (1.702 m)   Wt 156 lb (70.8 kg)   BMI 24.43 kg/m   Constitutional:  Alert and oriented, No acute distress. HEENT: [place] AT, moist mucus membranes.  Trachea midline, no masses. Cardiovascular: No clubbing, cyanosis, or edema. Respiratory: Normal respiratory effort, no increased work of breathing. GI: Abdomen is soft, nontender, nondistended, no abdominal masses GU: Declined DRE Skin: No rashes, bruises or suspicious lesions. Neurologic: Grossly intact, no focal deficits, moving all 4 extremities. Psychiatric: Normal mood and affect.   Assessment & Plan:    BPH with lower urinary tract symptoms Stable on combination therapy.  Tamsulosin and finasteride were refilled.  Chronic prostatitis/chronic pelvic pain syndrome Quiescent on amitriptyline which was refilled.  Continue annual follow-up   Abbie Sons, MD  [facility] [address] [place], [place] [address] [phone]

## 2020-03-31 ENCOUNTER — Telehealth: Payer: Self-pay

## 2020-03-31 NOTE — Telephone Encounter (Signed)
Patient left a vmail returning your call.

## 2020-04-05 ENCOUNTER — Other Ambulatory Visit: Payer: Self-pay | Admitting: *Deleted

## 2020-04-05 MED ORDER — FINASTERIDE 5 MG PO TABS: 5.0000 mg | ORAL_TABLET | Freq: Every day | ORAL | 0 refills | Status: DC

## 2020-04-05 NOTE — Telephone Encounter (Signed)
Refill [facility]asteride [facility]

## 2020-04-16 ENCOUNTER — Other Ambulatory Visit: Payer: Self-pay | Admitting: *Deleted

## 2020-04-16 DIAGNOSIS — N138 Other obstructive and reflux uropathy: Secondary | ICD-10-CM

## 2020-04-16 MED ORDER — AMITRIPTYLINE HCL 25 MG PO TABS: 25.0000 mg | ORAL_TABLET | Freq: Every day | ORAL | 0 refills | Status: DC

## 2020-04-16 MED ORDER — TAMSULOSIN HCL 0.4 MG PO CAPS: 0.4000 mg | ORAL_CAPSULE | Freq: Every day | ORAL | 0 refills | Status: DC

## 2020-06-16 ENCOUNTER — Ambulatory Visit: Payer: Medicare Other | Admitting: Urology

## 2020-07-01 ENCOUNTER — Other Ambulatory Visit: Payer: Self-pay | Admitting: Urology

## 2020-07-22 ENCOUNTER — Other Ambulatory Visit: Payer: Self-pay

## 2020-07-22 ENCOUNTER — Ambulatory Visit (INDEPENDENT_AMBULATORY_CARE_PROVIDER_SITE_OTHER): Payer: Medicare Other | Admitting: Urology

## 2020-07-22 ENCOUNTER — Other Ambulatory Visit: Payer: Self-pay | Admitting: Urology

## 2020-07-22 ENCOUNTER — Encounter: Payer: Self-pay | Admitting: Urology

## 2020-07-22 VITALS — BP 164/100 | HR 79 | Ht 67.0 in | Wt 168.0 lb

## 2020-07-22 DIAGNOSIS — N138 Other obstructive and reflux uropathy: Secondary | ICD-10-CM

## 2020-07-22 DIAGNOSIS — N411 Chronic prostatitis: Secondary | ICD-10-CM

## 2020-07-22 DIAGNOSIS — G894 Chronic pain syndrome: Secondary | ICD-10-CM | POA: Diagnosis not present

## 2020-07-22 DIAGNOSIS — N401 Enlarged prostate with lower urinary tract symptoms: Secondary | ICD-10-CM | POA: Diagnosis not present

## 2020-07-22 LAB — BLADDER SCAN AMB NON-IMAGING: Scan Result: 25

## 2020-07-22 NOTE — Progress Notes (Signed)
07/22/2020 2:23 PM   Wayne Marquez Sep 20, 1940 [id number]  Referring provider: Renee Rival, NP [place],  [place] [address]  Chief Complaint  Patient presents with  . Benign Prostatic Hypertrophy     Urologic history: 1.Chronic prostatitis/chronic pelvic pain syndrome -Diagnosed mid 1990s; well managed on amitriptyline  2.BPH with lower urinary tract symptoms -Stable on combination therapy tamsulosin/finasteride -Cystoscopy 02/2017 coapting lateral lobes and moderate intravesical median lobe  3.Elevated PSA -Prostate biopsy 2002 PSA 6.3 with benign pathology -Uncorrected PSA has been stable and he elected to discontinue PSA testing in 2018   HPI: 80 y.o. male presents for annual follow-up.   No problems since last years visit  Voiding without problems; ran out of finasteride approximately 2 weeks ago and remains on tamsulosin  No recurrent prostatitis symptoms on low-dose amitriptyline  Denies dysuria, gross hematuria  Denies flank, abdominal or pelvic pain  IPSS 8/35 with bother 1/6      PMH: Past Medical History:  Diagnosis Date  . Angina of effort ([place]) 01/20/2014  . Angina pectoris ([facility]) 01/20/2014  . Anticoagulation goal of INR 2 to 3 04/30/2017   Overview:  coumadin; CHADSvasc=3 age, htn, cad  . 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 Atrial fibrillation ([place]) 01/20/2014  . 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 CAD (coronary artery disease), native coronary artery 01/20/2014  . 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   Overview:  Medtronic Advisa  . 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 Elevated prostate specific antigen (PSA) 01/24/2013  . Gastroesophageal reflux disease 07/17/2014  . History of biliary T-tube placement 03/04/2014   Overview:  Overview:  EF>55%, mild LVH, mod MR, mod TR, RVSP=19mmHg TTE 8/26./2015  . History of lung cancer 04/30/2017  . Hyperglyceridemia 01/20/2014  . Hypertension,  benign 01/20/2014  . Kidney stone 07/17/2014  . Nephrolithiasis 04/30/2017  . Paroxysmal atrial fibrillation (HCC) 07/17/2014   Overview:  Symptomatic, Drug Refractory; SSS with dual chamber pacemaker in situ  Overview:  Overview:  Symptomatic, Drug Refractory; SSS with dual chamber pacemaker in situ  . Personal history of malignant neoplasm of bronchus and lung 01/20/2014  . Pre-syncope 07/17/2014  . Presence of cardiac pacemaker 06/15/2014   Overview:  Overview:  Medtronic Advisa  . Pulmonary hypertension ([place]) 07/17/2014   Overview:  RVSP=31mmHG by TTE 03/04/2014  Overview:  Overview:  RVSP=16mmHG by TTE 03/04/2014  . 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 Seasonal allergic rhinitis 07/17/2014   Overview:  Overview:  allergic rhinitis  . SI (sacroiliac) joint dysfunction 08/10/2014  . Sick sinus syndrome ([place] [facility]) 07/17/2014   Overview:  Overview:  sinoatrial node dysfunction with dual chamber pacemaker in situ; paroxysmal AFib  . Sinoatrial node dysfunction (HCC) 01/20/2014  . Syncope and collapse 01/20/2014    Surgical History: Past Surgical History:  Procedure Laterality Date  . CARDIAC ELECTROPHYSIOLOGY MAPPING AND ABLATION    . LEFT HEART CATH AND CORONARY ANGIOGRAPHY Left 09/20/2017   Procedure: LEFT HEART CATH AND CORONARY ANGIOGRAPHY;  Surgeon: Yolonda Kida, MD;  Location: [facility];  Service: Cardiovascular;  Laterality: Left;  . PACEMAKER IMPLANT  2014    Home Medications:  Allergies as of 07/22/2020      Reactions   Lisinopril    Other reaction(s): dont remember   Sulfa Antibiotics Other (See Comments)   GI UPSET      Medication List       Accurate as of July 22, 2020  2:23 PM. If you have  any questions, ask your nurse or doctor.        acetaminophen 325 MG tablet Commonly known as: TYLENOL Take by mouth.   amitriptyline 25 MG tablet Commonly known as: ELAVIL Take 1 tablet (25 mg total) by mouth at bedtime.   finasteride 5 MG tablet Commonly known as:  PROSCAR Take 1 tablet (5 mg total) by mouth daily.   losartan 25 MG tablet Commonly known as: COZAAR Take 25 mg by mouth 2 (two) times daily.   metoprolol tartrate 25 MG tablet Commonly known as: LOPRESSOR Take by mouth.   omeprazole 20 MG capsule Commonly known as: PRILOSEC Take 20 mg by mouth at bedtime.   rosuvastatin 20 MG tablet Commonly known as: CRESTOR   simvastatin 40 MG tablet Commonly known as: ZOCOR Take 40 mg by mouth daily.   sotalol 120 MG tablet Commonly known as: BETAPACE Take 120 mg by mouth 2 (two) times daily.   tamsulosin 0.4 MG Caps capsule Commonly known as: FLOMAX Take 1 capsule (0.4 mg total) by mouth daily.   warfarin 5 MG tablet Commonly known as: COUMADIN Take by mouth.       Allergies:  Allergies  Allergen Reactions  . Lisinopril     Other reaction(s): dont remember  . Sulfa Antibiotics Other (See Comments)    GI UPSET    Family History: Family History  Problem Relation Age of Onset  . Bladder Cancer Neg Hx   . Kidney cancer Neg Hx   . Prostate cancer Neg Hx     Social History:  reports that he has quit smoking. He has quit using smokeless tobacco. He reports that he does not drink alcohol and does not use drugs.   Physical Exam: BP (!) 164/100   Pulse 79   Ht 5\' 7"  (1.702 m)   Wt 168 lb (76.2 kg)   BMI 26.31 kg/m   Constitutional:  Alert and oriented, No acute distress. HEENT: [place] AT, moist mucus membranes.  Trachea midline, no masses. Cardiovascular: No clubbing, cyanosis, or edema. Skin: No rashes, bruises or suspicious lesions. Neurologic: Grossly intact, no focal deficits, moving all 4 extremities. Psychiatric: Normal mood and affect.    Assessment & Plan:    1. BPH with obstruction/lower urinary tract symptoms  Stable voiding symptoms on combination therapy  Tamsulosin/finasteride refilled   2.  Chronic prostatitis/chronic pelvic pain syndrome  Quiescent on low-dose amitriptyline  Refill  sent  Continue annual follow-up   Abbie Sons, MD  [facility] [address], [place], [place] [address] [phone]

## 2020-07-23 LAB — URINALYSIS, COMPLETE
Bilirubin, UA: NEGATIVE
Glucose, UA: NEGATIVE
Ketones, UA: NEGATIVE
Leukocytes,UA: NEGATIVE
Nitrite, UA: NEGATIVE
Protein,UA: NEGATIVE
RBC, UA: NEGATIVE
Specific Gravity, UA: 1.02 (ref 1.005–1.030)
Urobilinogen, Ur: 0.2 mg/dL (ref 0.2–1.0)
pH, UA: 5.5 (ref 5.0–7.5)

## 2020-07-23 LAB — MICROSCOPIC EXAMINATION: Bacteria, UA: NONE SEEN

## 2020-07-26 MED ORDER — AMITRIPTYLINE HCL 25 MG PO TABS: 25.0000 mg | ORAL_TABLET | Freq: Every day | ORAL | 3 refills | Status: DC

## 2020-07-26 MED ORDER — FINASTERIDE 5 MG PO TABS: 5.0000 mg | ORAL_TABLET | Freq: Every day | ORAL | 3 refills | Status: DC

## 2020-07-26 MED ORDER — TAMSULOSIN HCL 0.4 MG PO CAPS: 0.4000 mg | ORAL_CAPSULE | Freq: Every day | ORAL | 3 refills | Status: DC

## 2021-04-07 ENCOUNTER — Telehealth: Payer: Self-pay

## 2021-04-07 DIAGNOSIS — N138 Other obstructive and reflux uropathy: Secondary | ICD-10-CM

## 2021-04-07 MED ORDER — AMITRIPTYLINE HCL 25 MG PO TABS: 25.0000 mg | ORAL_TABLET | Freq: Every day | ORAL | 1 refills | Status: DC

## 2021-04-07 NOTE — Telephone Encounter (Signed)
Pt calls triage line requesting refill of amitriptyline. RX sent.

## 2021-07-22 ENCOUNTER — Ambulatory Visit (INDEPENDENT_AMBULATORY_CARE_PROVIDER_SITE_OTHER): Payer: Medicare Other | Admitting: Urology

## 2021-07-22 ENCOUNTER — Other Ambulatory Visit: Payer: Self-pay

## 2021-07-22 ENCOUNTER — Encounter: Payer: Self-pay | Admitting: Urology

## 2021-07-22 VITALS — BP 170/92 | HR 76 | Ht 67.0 in | Wt 175.0 lb

## 2021-07-22 DIAGNOSIS — N401 Enlarged prostate with lower urinary tract symptoms: Secondary | ICD-10-CM

## 2021-07-22 DIAGNOSIS — N138 Other obstructive and reflux uropathy: Secondary | ICD-10-CM | POA: Diagnosis not present

## 2021-07-22 DIAGNOSIS — N411 Chronic prostatitis: Secondary | ICD-10-CM | POA: Diagnosis not present

## 2021-07-22 DIAGNOSIS — G894 Chronic pain syndrome: Secondary | ICD-10-CM | POA: Diagnosis not present

## 2021-07-22 LAB — URINALYSIS, COMPLETE
Bilirubin, UA: NEGATIVE
Glucose, UA: NEGATIVE
Ketones, UA: NEGATIVE
Leukocytes,UA: NEGATIVE
Nitrite, UA: NEGATIVE
Protein,UA: NEGATIVE
RBC, UA: NEGATIVE
Specific Gravity, UA: 1.015 (ref 1.005–1.030)
Urobilinogen, Ur: 1 mg/dL (ref 0.2–1.0)
pH, UA: 6.5 (ref 5.0–7.5)

## 2021-07-22 LAB — BLADDER SCAN AMB NON-IMAGING: Scan Result: 70

## 2021-07-22 LAB — MICROSCOPIC EXAMINATION: Bacteria, UA: NONE SEEN

## 2021-07-22 MED ORDER — AMITRIPTYLINE HCL 50 MG PO TABS: 50.0000 mg | ORAL_TABLET | Freq: Every day | ORAL | 3 refills | Status: DC

## 2021-07-22 MED ORDER — FINASTERIDE 5 MG PO TABS: 5.0000 mg | ORAL_TABLET | Freq: Every day | ORAL | 3 refills | Status: DC

## 2021-07-22 MED ORDER — TAMSULOSIN HCL 0.4 MG PO CAPS: 0.4000 mg | ORAL_CAPSULE | Freq: Every day | ORAL | 3 refills | Status: DC

## 2021-07-22 NOTE — Progress Notes (Signed)
07/22/2021 1:47 PM   Wayne Marquez [birth year] [id number]  Referring provider: Renee Rival, NP [address] [place],  [place] [address]  Chief Complaint  Patient presents with   Benign Prostatic Hypertrophy    Urologic history: 1.  Chronic prostatitis/chronic pelvic pain syndrome -Diagnosed mid 1990s; well managed on amitriptyline   2.  BPH with lower urinary tract symptoms -Stable on combination therapy tamsulosin/finasteride -Cystoscopy 02/2017 coapting lateral lobes and moderate intravesical median lobe   3.  Elevated PSA -Prostate biopsy 2002 PSA 6.3 with benign pathology  -Uncorrected PSA has been stable and he elected to discontinue PSA testing in 2018   HPI: 81 y.o. male presents for annual follow-up.  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  He has been doubling up on the medication but has almost run out.  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 11/27/2016   Atrial fibrillation ([place]) 01/20/2014   BPH with obstruction/lower urinary tract symptoms 07/17/2014   CAD (coronary artery disease), native coronary artery 01/20/2014   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   Overview:  Medtronic Advisa   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   Elevated prostate specific antigen (PSA) 01/24/2013   Gastroesophageal reflux disease 07/17/2014   History of biliary T-tube placement 03/04/2014   Overview:  Overview:  EF>55%, mild LVH, mod MR, mod TR, RVSP=41mmHg TTE  8/26./2015   History of lung cancer 04/30/2017   Hyperglyceridemia 01/20/2014   Hypertension, benign 01/20/2014   Kidney stone 07/17/2014   Nephrolithiasis 04/30/2017   Paroxysmal atrial fibrillation ([place]) 07/17/2014   Overview:  Symptomatic, Drug Refractory; SSS with dual chamber pacemaker in situ  Overview:  Overview:  Symptomatic, Drug Refractory; SSS with dual chamber pacemaker in situ   Personal history of malignant neoplasm of bronchus and lung 01/20/2014   Pre-syncope 07/17/2014   Presence of cardiac pacemaker 06/15/2014   Overview:  Overview:  Medtronic Advisa   Pulmonary hypertension ([place]) 07/17/2014   Overview:  RVSP=96mmHG by TTE 03/04/2014  Overview:  Overview:  RVSP=77mmHG by TTE 03/04/2014   Pure hypertriglyceridemia 07/17/2014   Seasonal allergic rhinitis 07/17/2014   Overview:  Overview:  allergic rhinitis   SI (sacroiliac) joint dysfunction 08/10/2014   Sick sinus syndrome ([place]) 07/17/2014   Overview:  Overview:  sinoatrial node dysfunction with dual chamber pacemaker in situ; paroxysmal AFib   Sinoatrial node dysfunction ([place]) 01/20/2014   Syncope and collapse 01/20/2014    Surgical History: Past Surgical History:  Procedure Laterality Date   CARDIAC ELECTROPHYSIOLOGY MAPPING AND ABLATION     LEFT HEART CATH AND CORONARY ANGIOGRAPHY Left 09/20/2017   Procedure: LEFT HEART CATH AND CORONARY ANGIOGRAPHY;  Surgeon: Yolonda Kida, MD;  Location: [facility];  Service: Cardiovascular;  Laterality: Left;   PACEMAKER IMPLANT  2014    Home Medications:  Allergies as of 07/22/2021       Reactions   Lisinopril    Other reaction(s): dont remember   Sulfa Antibiotics Other (See  Comments)   GI UPSET        Medication List        Accurate as of July 22, 2021  1:47 PM. If you have any questions, ask your nurse or doctor.          acetaminophen 325 MG tablet Commonly known as: TYLENOL Take by mouth.   amitriptyline 50 MG tablet Commonly known as: ELAVIL Take 1  tablet (50 mg total) by mouth at bedtime. What changed:  medication strength how much to take Changed by: Abbie Sons, MD   finasteride 5 MG tablet Commonly known as: PROSCAR Take 1 tablet (5 mg total) by mouth daily.   losartan 25 MG tablet Commonly known as: COZAAR Take 25 mg by mouth 2 (two) times daily.   metoprolol tartrate 25 MG tablet Commonly known as: LOPRESSOR Take by mouth.   omeprazole 20 MG capsule Commonly known as: PRILOSEC Take 20 mg by mouth at bedtime.   rosuvastatin 20 MG tablet Commonly known as: CRESTOR   simvastatin 40 MG tablet Commonly known as: ZOCOR Take 40 mg by mouth daily.   sotalol 120 MG tablet Commonly known as: BETAPACE Take 120 mg by mouth 2 (two) times daily.   tamsulosin 0.4 MG Caps capsule Commonly known as: FLOMAX Take 1 capsule (0.4 mg total) by mouth daily.   warfarin 5 MG tablet Commonly known as: COUMADIN Take by mouth.        Allergies:  Allergies  Allergen Reactions   Lisinopril     Other reaction(s): dont remember   Sulfa Antibiotics Other (See Comments)    GI UPSET    Family History: Family History  Problem Relation Age of Onset   Bladder Cancer Neg Hx    Kidney cancer Neg Hx    Prostate cancer Neg Hx     Social History:  reports that he has quit smoking. He has quit using smokeless tobacco. He reports that he does not drink alcohol and does not use drugs.   Physical Exam: BP (!) 170/92    Pulse 76    Ht 5\' 7"  (1.702 m)    Wt 175 lb (79.4 kg)    BMI 27.41 kg/m   Constitutional:  Alert and oriented, No acute distress. HEENT: [name] AT, moist mucus membranes.  Trachea midline, no masses. Cardiovascular: No clubbing, cyanosis, or edema. Respiratory: Normal respiratory effort, no increased work of breathing. Psychiatric: Normal mood and affect.   Assessment & Plan:    1. BPH with obstruction/lower urinary tract symptoms Stable on tamsulosin/finasteride Refill sent to pharmacy Bladder scan PVR 70  mL  2. Chronic prostatitis/chronic pelvic pain syndrome Stable Amitriptyline refilled at 50 mg   Abbie Sons, [place] [address] [place], [place] [address] [phone]

## 2021-10-04 ENCOUNTER — Other Ambulatory Visit: Payer: Self-pay

## 2021-10-04 ENCOUNTER — Emergency Department: Payer: Medicare Other

## 2021-10-04 ENCOUNTER — Encounter: Payer: Self-pay | Admitting: *Deleted

## 2021-10-04 ENCOUNTER — Inpatient Hospital Stay
Admission: EM | Admit: 2021-10-04 | Discharge: 2021-10-07 | DRG: 312 | Disposition: A | Discharge status: Home / Self Care | Payer: Medicare Other | Attending: Internal Medicine | Admitting: Internal Medicine

## 2021-10-04 DIAGNOSIS — I272 Pulmonary hypertension, unspecified: Secondary | ICD-10-CM | POA: Diagnosis present

## 2021-10-04 DIAGNOSIS — E781 Pure hyperglyceridemia: Secondary | ICD-10-CM | POA: Diagnosis present

## 2021-10-04 DIAGNOSIS — Z7901 Long term (current) use of anticoagulants: Secondary | ICD-10-CM

## 2021-10-04 DIAGNOSIS — Z87442 Personal history of urinary calculi: Secondary | ICD-10-CM

## 2021-10-04 DIAGNOSIS — I951 Orthostatic hypotension: Principal | ICD-10-CM | POA: Diagnosis present

## 2021-10-04 DIAGNOSIS — E2740 Unspecified adrenocortical insufficiency: Secondary | ICD-10-CM

## 2021-10-04 DIAGNOSIS — I160 Hypertensive urgency: Secondary | ICD-10-CM

## 2021-10-04 DIAGNOSIS — Z888 Allergy status to other drugs, medicaments and biological substances status: Secondary | ICD-10-CM

## 2021-10-04 DIAGNOSIS — R7989 Other specified abnormal findings of blood chemistry: Secondary | ICD-10-CM

## 2021-10-04 DIAGNOSIS — N400 Enlarged prostate without lower urinary tract symptoms: Secondary | ICD-10-CM | POA: Diagnosis present

## 2021-10-04 DIAGNOSIS — G629 Polyneuropathy, unspecified: Secondary | ICD-10-CM | POA: Diagnosis present

## 2021-10-04 DIAGNOSIS — R42 Dizziness and giddiness: Principal | ICD-10-CM

## 2021-10-04 DIAGNOSIS — E538 Deficiency of other specified B group vitamins: Secondary | ICD-10-CM

## 2021-10-04 DIAGNOSIS — E041 Nontoxic single thyroid nodule: Secondary | ICD-10-CM

## 2021-10-04 DIAGNOSIS — I480 Paroxysmal atrial fibrillation: Secondary | ICD-10-CM | POA: Diagnosis present

## 2021-10-04 DIAGNOSIS — I10 Essential (primary) hypertension: Secondary | ICD-10-CM | POA: Diagnosis present

## 2021-10-04 DIAGNOSIS — Z87891 Personal history of nicotine dependence: Secondary | ICD-10-CM

## 2021-10-04 DIAGNOSIS — E271 Primary adrenocortical insufficiency: Secondary | ICD-10-CM | POA: Diagnosis present

## 2021-10-04 DIAGNOSIS — Z79899 Other long term (current) drug therapy: Secondary | ICD-10-CM

## 2021-10-04 DIAGNOSIS — I495 Sick sinus syndrome: Secondary | ICD-10-CM | POA: Diagnosis present

## 2021-10-04 DIAGNOSIS — Z902 Acquired absence of lung [part of]: Secondary | ICD-10-CM

## 2021-10-04 DIAGNOSIS — Z950 Presence of cardiac pacemaker: Secondary | ICD-10-CM | POA: Diagnosis present

## 2021-10-04 DIAGNOSIS — Z951 Presence of aortocoronary bypass graft: Secondary | ICD-10-CM

## 2021-10-04 DIAGNOSIS — R208 Other disturbances of skin sensation: Secondary | ICD-10-CM

## 2021-10-04 DIAGNOSIS — K219 Gastro-esophageal reflux disease without esophagitis: Secondary | ICD-10-CM | POA: Diagnosis present

## 2021-10-04 DIAGNOSIS — Z85118 Personal history of other malignant neoplasm of bronchus and lung: Secondary | ICD-10-CM

## 2021-10-04 DIAGNOSIS — I2510 Atherosclerotic heart disease of native coronary artery without angina pectoris: Secondary | ICD-10-CM | POA: Diagnosis present

## 2021-10-04 DIAGNOSIS — Z881 Allergy status to other antibiotic agents status: Secondary | ICD-10-CM

## 2021-10-04 LAB — PROTIME-INR
INR: 2.1 — ABNORMAL HIGH (ref 0.8–1.2)
Prothrombin Time: 23.3 seconds — ABNORMAL HIGH (ref 11.4–15.2)

## 2021-10-04 LAB — BASIC METABOLIC PANEL
Anion gap: 9 (ref 5–15)
BUN: 13 mg/dL (ref 8–23)
CO2: 26 mmol/L (ref 22–32)
Calcium: 9 mg/dL (ref 8.9–10.3)
Chloride: 105 mmol/L (ref 98–111)
Creatinine, Ser: 1.06 mg/dL (ref 0.61–1.24)
GFR, Estimated: >60 mL/min (ref >60)
Glucose, Bld: 112 mg/dL — ABNORMAL HIGH (ref 70–99)
Potassium: 3.9 mmol/L (ref 3.5–5.1)
Sodium: 140 mmol/L (ref 135–145)

## 2021-10-04 LAB — URINALYSIS, COMPLETE (UACMP) WITH MICROSCOPIC
Bacteria, UA: NONE SEEN
Bilirubin Urine: NEGATIVE
Glucose, UA: NEGATIVE mg/dL
Hgb urine dipstick: NEGATIVE
Ketones, ur: NEGATIVE mg/dL
Leukocytes,Ua: NEGATIVE
Nitrite: NEGATIVE
Protein, ur: NEGATIVE mg/dL
Specific Gravity, Urine: 1.008 (ref 1.005–1.030)
Squamous Epithelial / HPF: NONE SEEN (ref 0–5)
pH: 7 (ref 5.0–8.0)

## 2021-10-04 LAB — CBC
HCT: 46.6 % (ref 39.0–52.0)
Hemoglobin: 16 g/dL (ref 13.0–17.0)
MCH: 30.7 pg (ref 26.0–34.0)
MCHC: 34.3 g/dL (ref 30.0–36.0)
MCV: 89.4 fL (ref 80.0–100.0)
Platelets: 249 10*3/uL (ref 150–400)
RBC: 5.21 MIL/uL (ref 4.22–5.81)
RDW: 13.2 % (ref 11.5–15.5)
WBC: 15.1 10*3/uL — ABNORMAL HIGH (ref 4.0–10.5)
nRBC: 0 % (ref 0.0–0.2)

## 2021-10-04 LAB — TROPONIN I (HIGH SENSITIVITY)
Troponin I (High Sensitivity): 6 ng/L (ref <18)
Troponin I (High Sensitivity): 6 ng/L (ref <18)

## 2021-10-04 MED ORDER — ROSUVASTATIN CALCIUM 10 MG PO TABS
10.0000 mg | ORAL_TABLET | Freq: Every day | ORAL | Status: DC
  Administered 2021-10-05 – 2021-10-07 (×3): 10 mg via ORAL
  Filled 2021-10-04 (×3): qty 1

## 2021-10-04 MED ORDER — FINASTERIDE 5 MG PO TABS
5.0000 mg | ORAL_TABLET | Freq: Every day | ORAL | Status: DC
  Administered 2021-10-05 – 2021-10-07 (×3): 5 mg via ORAL
  Filled 2021-10-04 (×3): qty 1

## 2021-10-04 MED ORDER — TAMSULOSIN HCL 0.4 MG PO CAPS
0.4000 mg | ORAL_CAPSULE | Freq: Every day | ORAL | Status: DC
Start: 2021-10-05 — End: 2021-10-05
  Filled 2021-10-04: qty 1

## 2021-10-04 MED ORDER — AMITRIPTYLINE HCL 50 MG PO TABS
50.0000 mg | ORAL_TABLET | Freq: Every day | ORAL | Status: DC
  Administered 2021-10-04 – 2021-10-06 (×3): 50 mg via ORAL
  Filled 2021-10-04 (×3): qty 1

## 2021-10-04 MED ORDER — WARFARIN - PHARMACIST DOSING INPATIENT
Freq: Every day | Status: DC
  Filled 2021-10-04: qty 1

## 2021-10-04 MED ORDER — ACETAMINOPHEN 160 MG/5ML PO SOLN
650.0000 mg | ORAL | Status: DC | PRN
  Filled 2021-10-04: qty 20.3

## 2021-10-04 MED ORDER — ACETAMINOPHEN 650 MG RE SUPP: 650.0000 mg | RECTAL | Status: DC | PRN

## 2021-10-04 MED ORDER — MECLIZINE HCL 25 MG PO TABS
12.5000 mg | ORAL_TABLET | Freq: Once | ORAL | Status: AC
  Administered 2021-10-04: 12.5 mg via ORAL
  Filled 2021-10-04: qty 1

## 2021-10-04 MED ORDER — STROKE: EARLY STAGES OF RECOVERY BOOK: Freq: Once | Status: DC

## 2021-10-04 MED ORDER — IOHEXOL 350 MG/ML SOLN
75.0000 mL | Freq: Once | INTRAVENOUS | Status: AC | PRN
  Administered 2021-10-04: 75 mL via INTRAVENOUS
  Filled 2021-10-04: qty 75

## 2021-10-04 MED ORDER — WARFARIN SODIUM 5 MG PO TABS: 5.0000 mg | ORAL_TABLET | Freq: Every day | ORAL | Status: DC

## 2021-10-04 MED ORDER — ACETAMINOPHEN 325 MG PO TABS
650.0000 mg | ORAL_TABLET | ORAL | Status: DC | PRN
  Administered 2021-10-05 – 2021-10-06 (×2): 650 mg via ORAL
  Filled 2021-10-04: qty 2

## 2021-10-04 MED ORDER — LOSARTAN POTASSIUM 50 MG PO TABS
25.0000 mg | ORAL_TABLET | Freq: Two times a day (BID) | ORAL | Status: DC
Start: 2021-10-04 — End: 2021-10-05
  Administered 2021-10-04: 25 mg via ORAL
  Filled 2021-10-04 (×2): qty 1

## 2021-10-04 MED ORDER — WARFARIN SODIUM 5 MG PO TABS
5.0000 mg | ORAL_TABLET | Freq: Once | ORAL | Status: AC
  Administered 2021-10-04: 5 mg via ORAL
  Filled 2021-10-04: qty 1

## 2021-10-04 NOTE — Assessment & Plan Note (Deleted)
Currently in paced rhythm.  Continue metoprolol and warfarin ?

## 2021-10-04 NOTE — H&P (Signed)
?History and Physical  ? ? ?Patient: Wayne Marquez IDP:[id number] DOB: [birth year] ?DOA: 10/04/2021 ?DOS: the patient was seen and examined on 10/04/2021 ?PCP: Renee Rival, NP  ?Patient coming from: Home ? ?Chief Complaint:  ?Chief Complaint  ?Patient presents with  ? Hypertension  ? ? ?HPI: Wayne Marquez is a 81 y.o. 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  He has been compliant with his medication.  He denies one-sided weakness, numbness, tingling, facial droop, slurred speech or visual disturbance.  He denies chest pain or shortness of breath. ?ED course: BP in the ED 185/88 with mild bradycardia 59 and otherwise normal vitals.  Labs significant for WBC 15,000.  CBC, BMP, UA, troponin otherwise unremarkable.  INR pending.  EKG, personally viewed and interpreted: Paced rhythm at 65 with no acute ST-T wave changes.  Chest x-ray stable.  CT angio head and neck showed the following findings: ?IMPRESSION: ?1. 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 ?2. Approximately 50% stenosis in the proximal left ICA, secondary to ?calcified and noncalcified plaque. ?3.  No intracranial large vessel occlusion or significant stenosis. ?4. 1.5 cm hypoattenuating lesion in the thyroid. If this has not ?previously been evaluated, a non-emergent ultrasound of the thyroid ?is recommended ? ?Patient treated with meclizine.  Hospitalist consulted for overnight observation.  ? ?Review of Systems: As mentioned in the history of present illness.  All other systems reviewed and are negative. ?Past Medical History:  ?Diagnosis Date  ? Angina of effort ([place]) 01/20/2014  ? Angina pectoris ([place]) 01/20/2014  ? Anticoagulation goal of INR 2 to 3 04/30/2017  ? Overview:  coumadin; CHADSvasc=3 age, htn, cad  ? 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 Atrial fibrillation ([place]) 01/20/2014  ? 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 CAD (coronary artery disease), native coronary artery 01/20/2014  ? 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 Overview:  [employer] Advisa  ? 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 Elevated prostate specific antigen (PSA) 01/24/2013  ? Gastroesophageal reflux disease 07/17/2014  ? History of biliary T-tube placement 03/04/2014  ? Overview:  Overview:  EF>55%, mild LVH, mod MR, mod TR, RVSP=85mHg TTE 8/26./2015  ? History of lung cancer 04/30/2017  ? Hyperglyceridemia 01/20/2014  ? Hypertension, benign 01/20/2014  ? Kidney stone 07/17/2014  ? 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 Paroxysmal atrial fibrillation ([place] 07/17/2014  ? 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 Personal history of malignant neoplasm of bronchus and lung 01/20/2014  ? Pre-syncope 07/17/2014  ? Presence of cardiac pacemaker 06/15/2014  ? Overview:  Overview:  [employer] Advisa  ? Pulmonary hypertension ([place] 07/17/2014  ? Overview:  RVSP=423mG by TTE 03/04/2014  Overview:  Overview:  RVSP=4710m by TTE 03/04/2014  ? 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 Seasonal allergic rhinitis 07/17/2014  ? Overview:  Overview:  allergic rhinitis  ? SI (sacroiliac) joint dysfunction 08/10/2014  ? Sick sinus syndrome ([place]/02/2015  ? Overview:  Overview:  sinoatrial  node dysfunction with dual chamber pacemaker in situ; paroxysmal AFib  ? Sinoatrial node dysfunction ([place]) 01/20/2014  ? Syncope and collapse 01/20/2014  ? ?Past Surgical History:  ?Procedure Laterality  Date  ? CARDIAC ELECTROPHYSIOLOGY MAPPING AND ABLATION    ? LEFT HEART CATH AND CORONARY ANGIOGRAPHY Left 09/20/2017  ? Procedure: LEFT HEART CATH AND CORONARY ANGIOGRAPHY;  Surgeon: Yolonda Kida, MD;  Location: [facility];  Service: Cardiovascular;  Laterality: Left;  ? PACEMAKER IMPLANT  2014  ? ?Social History:  reports that he has quit smoking. He has quit using smokeless tobacco. He reports that he does not drink alcohol and does not use drugs. ? ?Allergies  ?Allergen Reactions  ? Lisinopril   ?  Other reaction(s): dont remember  ? Sulfa Antibiotics Other (See Comments)  ?  GI UPSET  ? ? ?Family History  ?Problem Relation Age of Onset  ? Bladder Cancer Neg Hx   ? Kidney cancer Neg Hx   ? Prostate cancer Neg Hx   ? ? ?Prior to Admission medications   ?Medication Sig Start Date End Date Taking? Authorizing Provider  ?acetaminophen (TYLENOL) 325 MG tablet Take by mouth.    [provider]  ?amitriptyline (ELAVIL) 50 MG tablet Take 1 tablet (50 mg total) by mouth at bedtime. 07/22/21   Stoioff, Ronda Fairly, MD  ?finasteride (PROSCAR) 5 MG tablet Take 1 tablet (5 mg total) by mouth daily. 07/22/21   Stoioff, Ronda Fairly, MD  ?losartan (COZAAR) 25 MG tablet Take 25 mg by mouth 2 (two) times daily.  02/19/17   [provider]  ?metoprolol tartrate (LOPRESSOR) 25 MG tablet Take by mouth. 01/02/18 06/13/19  [provider]  ?omeprazole (PRILOSEC) 20 MG capsule Take 20 mg by mouth at bedtime.  03/05/17   [provider]  ?rosuvastatin (CRESTOR) 20 MG tablet  04/27/18   [provider]  ?simvastatin (ZOCOR) 40 MG tablet Take 40 mg by mouth daily.  03/26/17   [provider]  ?sotalol (BETAPACE) 120 MG tablet Take 120 mg by mouth 2 (two) times daily.  03/05/17   [provider]  ?tamsulosin (FLOMAX) 0.4 MG CAPS capsule Take 1 capsule (0.4 mg total) by mouth daily. 07/22/21   Stoioff, Ronda Fairly, MD  ?warfarin (COUMADIN) 5 MG tablet Take by mouth. 09/10/18 09/10/19   [provider]  ? ? ?Physical Exam: ?Vitals:  ? 10/04/21 1510 10/04/21 1513 10/04/21 1737 10/04/21 1935  ?BP:  (!) 185/88 (!) 179/69 (!) 181/75  ?Pulse:  (!) 59 63 (!) 59  ?Resp:  '20 18 16  '$ ?Temp:  97.7 ?F (36.5 ?C)    ?TempSrc:  Oral    ?SpO2:  98% 98% 98%  ?Weight: 79.4 kg     ?Height: '5\' 7"'$  (1.702 m)     ? ?Physical Exam ?Vitals and nursing note reviewed.  ?Constitutional:   ?   General: He is not in acute distress. ?HENT:  ?   Head: Normocephalic and atraumatic.  ?Cardiovascular:  ?   Rate and Rhythm: Normal rate and regular rhythm.  ?   Pulses: Normal pulses.  ?   Heart sounds: Normal heart sounds.  ?Pulmonary:  ?   Effort: Pulmonary effort is normal.  ?   Breath sounds: Normal breath sounds.  ?Abdominal:  ?   Palpations: Abdomen is soft.  ?   Tenderness: There is no abdominal tenderness.  ? ? ? ?Data Reviewed: ?Relevant notes from primary care and specialist visits, past discharge summaries as  available in EHR, including Care Everywhere. ?Prior diagnostic testing as pertinent to current admission diagnoses ?Updated medications and problem lists for reconciliation ?ED course, including vitals, labs, imaging, treatment and response to treatment ?Triage notes, nursing and pharmacy notes and ED provider's notes ?Notable results as noted in HPI ? ? ?Assessment and Plan: ?* Dizziness ?Etiology uncertain but patient with stroke risk factors ?Chronic appearing findings on CTA head and neck ?Stroke work-up to include continuous cardiac monitoring, echo and MRI ( if pacemaker compatible) ?PT OT and speech consult ?Continue warfarin and metoprolol and rosuvastatin ?Neurology consult ? ?Hypertensive urgency ?BP control as patient is beyond 48-hour window for permissive hypertension for possible stroke ?Continue metoprolol, losartan ? ?Paroxysmal atrial fibrillation (HCC) ?Currently in paced rhythm.  Continue metoprolol and warfarin ? ?Warfarin anticoagulation ?Pharmacy consult for warfarin management.  Keep INR  2-3 ? ?Thyroid nodule ?Incidental finding on CTA head and neck ?Nonemergent ultrasound recommended.  Consider inpatient versus outpatient ? ?S/P coronary artery bypass graft x 3 ?Management as above ? ?P

## 2021-10-04 NOTE — ED Triage Notes (Signed)
Pt comes into the ED via EMS from home c/o elevated b/p, takes b/p meds PRN at home, 220/110, felt swimmy headed with staggering gate today.  ? ?#18gLFA ?HR60 ?180/90 ?99%RA ?

## 2021-10-04 NOTE — Assessment & Plan Note (Addendum)
Thyroid nodule seen on CT scan.  Will need outpatient ultrasound and endocrine follow-up for this.  TSH and free T4 in the normal range. ?

## 2021-10-04 NOTE — Consult Note (Signed)
ANTICOAGULATION CONSULT NOTE ? ?Pharmacy Consult for Warfarin ?Indication: atrial fibrillation ? ?Patient Measurements: ?Height: '5\' 7"'$  (170.2 cm) ?Weight: 79.4 kg (175 lb) ?IBW/kg (Calculated) : 66.1 ? ?Labs: ?Recent Labs  ?  10/04/21 ?1515 10/04/21 ?1712 10/04/21 ?1858  ?HGB 16.0  --   --   ?HCT 46.6  --   --   ?PLT 249  --   --   ?LABPROT  --   --  23.3*  ?INR  --   --  2.1*  ?CREATININE 1.06  --   --   ?TROPONINIHS 6 6  --   ? ? ?Estimated Creatinine Clearance: 58 mL/min (by C-G formula based on SCr of 1.06 mg/dL). ? ? ?Medical History: ?Past Medical History:  ?Diagnosis Date  ? Angina of effort ([place]) 01/20/2014  ? Angina pectoris ([place]) 01/20/2014  ? Anticoagulation goal of INR 2 to 3 04/30/2017  ? Overview:  coumadin; CHADSvasc=3 age, htn, cad  ? 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 Atrial fibrillation ([place]) 01/20/2014  ? 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 CAD (coronary artery disease), native coronary artery 01/20/2014  ? 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 Overview:  [employer] Advisa  ? 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 Elevated prostate specific antigen (PSA) 01/24/2013  ? Gastroesophageal reflux disease 07/17/2014  ? History of biliary T-tube placement 03/04/2014  ? Overview:  Overview:  EF>55%, mild LVH, mod MR, mod TR, RVSP=43mHg TTE 8/26./2015  ? History of lung cancer 04/30/2017  ? Hyperglyceridemia 01/20/2014  ? Hypertension, benign 01/20/2014  ? Kidney stone 07/17/2014  ? 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 Paroxysmal atrial fibrillation ([place] 07/17/2014  ? 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 Personal history of malignant neoplasm of bronchus and lung 01/20/2014  ? Pre-syncope 07/17/2014  ? Presence of cardiac pacemaker 06/15/2014  ? Overview:  Overview:  [employer] Advisa  ? Pulmonary hypertension ([place] 07/17/2014  ? Overview:  RVSP=46mG by TTE  03/04/2014  Overview:  Overview:  RVSP=47104m by TTE 03/04/2014  ? 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 Seasonal allergic rhinitis 07/17/2014  ? Overview:  Overview:  allergic rhinitis  ? SI (sacroiliac) joint dysfunction 08/10/2014  ? Sick sinus syndrome ([place]/02/2015  ? Overview:  Overview:  sinoatrial node dysfunction with dual chamber pacemaker in situ; paroxysmal AFib  ? Sinoatrial node dysfunction ([facility]/14/2015  ? Syncope and collapse 01/20/2014  ? ? ?Medications:  ?Warfarin 5 mg qSupper prior to admission. Last patient reported dose was 10/03/21 ? ?Assessment: ?Patient is a [age]o M with medical history as above and including Afib on warfarin who presented to the ED with hypertension and unsteadiness. Patient to be admitted for further observation. Pharmacy consult to assist with warfarin management while patient is in hospital. ? ?Goal of Therapy:  ?INR 2-3 ? ?Plan:  ?--Warfarin 5 mg daily ?--Daily INR per protocol ?--CBC at least every 3 days per protocol ? ?AleBenita Gutter/28/2023,8:39 PM ? ? ?

## 2021-10-04 NOTE — Assessment & Plan Note (Addendum)
Follow up as outpatient.  

## 2021-10-04 NOTE — Assessment & Plan Note (Deleted)
BP control as patient is beyond 48-hour window for permissive hypertension for possible stroke ?Continue metoprolol, losartan ?

## 2021-10-04 NOTE — Assessment & Plan Note (Deleted)
Pharmacy consult for warfarin management.  Keep INR 2-3 ?

## 2021-10-04 NOTE — ED Triage Notes (Signed)
Pt brought in via ems from home with hypertension.  Pt reports high blood for several months.  Pt takes bp meds prn.  Pt reports sob.  No chest pain  pt alert.   ?

## 2021-10-04 NOTE — Assessment & Plan Note (Addendum)
Patient on Crestor. ?

## 2021-10-04 NOTE — Assessment & Plan Note (Deleted)
Etiology uncertain but patient with stroke risk factors ?Chronic appearing findings on CTA head and neck ?Stroke work-up to include continuous cardiac monitoring, echo and MRI ( if pacemaker compatible) ?PT OT and speech consult ?Continue warfarin and metoprolol and rosuvastatin ?Neurology consult ?

## 2021-10-04 NOTE — Assessment & Plan Note (Deleted)
We will get pacemaker check ?

## 2021-10-04 NOTE — Assessment & Plan Note (Addendum)
Continue Proscar.  Hold Flomax with orthostatic hypotension today.  May need to restart if has urinary issues (but I would restart it at night if he has to restart it) ?

## 2021-10-04 NOTE — Discharge Instructions (Addendum)
Treating adrenal insufficiency with cortef ? ?If your blood pressure goes high (over 180/100 but also check bp while standing) can take norvasc 5 mg po as needed ? ?If you have trouble getting your urine out can restart flomax (tamsulosin) at night ? ?Recommend referral to endocrinologist as outpatient ?

## 2021-10-04 NOTE — ED Provider Notes (Signed)
? ?[facility] ?Provider Note ? ? ? Event Date/Time  ? First MD Initiated Contact with Patient 10/04/21 1616   ?  (approximate) ? ? ?History  ? ?Hypertension ? ? ?HPI ? ?Wayne Marquez is a 81 y.o. male history of A-fib pacemaker on anticoagulation presents to the ER for evaluation of concern over elevated blood pressure as well as some unsteadiness over the past few days.  Denies any chest pain or pressure no shortness of breath no lower extremity swelling.  Has had some intermittent headaches not the worst headache of his life no sudden onset headaches.  No blurry vision.  States has been compliant with his medication. ?  ? ? ?Physical Exam  ? ?Triage Vital Signs: ?ED Triage Vitals  ?Enc Vitals Group  ?   BP 10/04/21 1513 (!) 185/88  ?   Pulse Rate 10/04/21 1513 (!) 59  ?   Resp 10/04/21 1513 20  ?   Temp 10/04/21 1513 97.7 ?F (36.5 ?C)  ?   Temp Source 10/04/21 1513 Oral  ?   SpO2 10/04/21 1513 98 %  ?   Weight 10/04/21 1510 175 lb (79.4 kg)  ?   Height 10/04/21 1510 '5\' 7"'$  (1.702 m)  ?   Head Circumference --   ?   Peak Flow --   ?   Pain Score 10/04/21 1510 0  ?   Pain Loc --   ?   Pain Edu? --   ?   Excl. in [place]? --   ? ? ?Most recent vital signs: ?Vitals:  ? 10/04/21 1737 10/04/21 1935  ?BP: (!) 179/69 (!) 181/75  ?Pulse: 63 (!) 59  ?Resp: 18 16  ?Temp:    ?SpO2: 98% 98%  ? ? ? ?Constitutional: Alert  ?Eyes: Conjunctivae are normal.  ?Head: Atraumatic. ?Nose: No congestion/rhinnorhea. ?Mouth/Throat: Mucous membranes are moist.   ?Neck: Painless ROM.  ?Cardiovascular:   Good peripheral circulation. Regular rate and rhythm ?Respiratory: Normal respiratory effort.  No retractions. ctab ?Gastrointestinal: Soft and nontender. No distension ?Musculoskeletal:  no deformity ?Neurologic: CN- intact.  No facial droop, Normal FNF.  Normal heel to shin.  Sensation intact bilaterally. Normal speech and language. No gross focal neurologic deficits are appreciated. No gait instability. ?Skin:  Skin  is warm, dry and intact. No rash noted. ?Psychiatric: Mood and affect are normal. Speech and behavior are normal. ? ? ? ?ED Results / Procedures / Treatments  ? ?Labs ?(all labs ordered are listed, but only abnormal results are displayed) ?Labs Reviewed  ?BASIC METABOLIC PANEL - Abnormal; Notable for the following components:  ?    Result Value  ? Glucose, Bld 112 (*)   ? All other components within normal limits  ?CBC - Abnormal; Notable for the following components:  ? WBC 15.1 (*)   ? All other components within normal limits  ?URINALYSIS, COMPLETE (UACMP) WITH MICROSCOPIC - Abnormal; Notable for the following components:  ? Color, Urine YELLOW (*)   ? APPearance CLEAR (*)   ? All other components within normal limits  ?PROTIME-INR - Abnormal; Notable for the following components:  ? Prothrombin Time 23.3 (*)   ? INR 2.1 (*)   ? All other components within normal limits  ?TROPONIN I (HIGH SENSITIVITY)  ?TROPONIN I (HIGH SENSITIVITY)  ? ? ? ?EKG ? ?ED ECG REPORT ?I, Merlyn Lot, the attending physician, personally viewed and interpreted this ECG. ? ? Date: 10/04/2021 ? EKG Time: 15:20 ? Rate: 60 ? Rhythm:  a-paced ? Axis: normal ? Intervals:normal qtc ? ST&T Change: no stemi, paced rhythm ? ? ? ?RADIOLOGY ?Please see ED Course for my review and interpretation. ? ?I personally reviewed all radiographic images ordered to evaluate for the above acute complaints and reviewed radiology reports and findings.  These findings were personally discussed with the patient.  Please see medical record for radiology report. ? ? ? ?PROCEDURES: ? ?Critical Care performed: No ? ?Procedures ? ? ?MEDICATIONS ORDERED IN ED: ?Medications  ?meclizine (ANTIVERT) tablet 12.5 mg (12.5 mg Oral Given 10/04/21 1818)  ?iohexol (OMNIPAQUE) 350 MG/ML injection 75 mL (75 mLs Intravenous Contrast Given 10/04/21 1826)  ? ? ? ?IMPRESSION / MDM / ASSESSMENT AND PLAN / ED COURSE  ?I reviewed the triage vital signs and the nursing notes. ?              ?               ? ?Differential diagnosis includes, but is not limited to, hypertensive urgency, press, CVA, SAH, mass, SDH, CHF, AKI, medication noncompliance, uti ? ?Patient presenting with symptoms as described above.  Clinically nontoxic-appearing is hypertensive.  EKG nonischemic initial troponin negative.  Denying any chest pain.  Have a low suspicion for ACS.  Given age and risk factors CT imaging will be ordered.  If CT imaging is negative likely will need MRI.  Will consider admission but otherwise clinically well-appearing.  Will observe here in the ER to determine final disposition ? ? ?Clinical Course as of 10/04/21 2006  ?Tue Oct 04, 2021  ?1823 Patient reassessed.  Patient without any focal deficits but is unsteady with ambulation.  Possible BPPV will give meclizine.  Unable to MRI as the patient has pacemaker will order CTA to evaluate for evidence of vascular lesion patient already medically optimized. [PR]  ?1855 CTA by my review and interpretation does not show evidence of LVO.  Will await formal radiology report. [PR]  ?1910 Patient attempted ambulation and was still unsteady.  Did feel some improvement after meclizine for possible vertigo but given his hypertension risk factors and unable to MRI do believe he would benefit from observation in the hospital for neuro consult.  Will consult hospitalist. [PR]  ?2005 Case discussed in consultation with hospitalist who agreed admit patient to their service [PR]  ?  ?Clinical Course User Index ?[PR] Merlyn Lot, MD  ? ? ? ?FINAL CLINICAL IMPRESSION(S) / ED DIAGNOSES  ? ?Final diagnoses:  ?Dizziness  ?Hypertension, unspecified type  ? ? ? ?Rx / DC Orders  ? ?ED Discharge Orders   ? ? None  ? ?  ? ? ? ?Note:  This document was prepared using Dragon voice recognition software and may include unintentional dictation errors. ? ?  ?Merlyn Lot, MD ?10/04/21 2006 ? ?

## 2021-10-05 ENCOUNTER — Observation Stay
Admit: 2021-10-05 | Discharge: 2021-10-05 | Disposition: A | Discharge status: Home / Self Care | Payer: Medicare Other | Attending: Internal Medicine | Admitting: Internal Medicine

## 2021-10-05 DIAGNOSIS — Z7901 Long term (current) use of anticoagulants: Secondary | ICD-10-CM | POA: Diagnosis not present

## 2021-10-05 DIAGNOSIS — I10 Essential (primary) hypertension: Secondary | ICD-10-CM | POA: Diagnosis present

## 2021-10-05 DIAGNOSIS — E271 Primary adrenocortical insufficiency: Secondary | ICD-10-CM | POA: Diagnosis present

## 2021-10-05 DIAGNOSIS — I951 Orthostatic hypotension: Secondary | ICD-10-CM | POA: Diagnosis not present

## 2021-10-05 DIAGNOSIS — E538 Deficiency of other specified B group vitamins: Secondary | ICD-10-CM | POA: Diagnosis present

## 2021-10-05 DIAGNOSIS — N401 Enlarged prostate with lower urinary tract symptoms: Secondary | ICD-10-CM | POA: Diagnosis not present

## 2021-10-05 DIAGNOSIS — I480 Paroxysmal atrial fibrillation: Secondary | ICD-10-CM | POA: Diagnosis not present

## 2021-10-05 DIAGNOSIS — E2740 Unspecified adrenocortical insufficiency: Secondary | ICD-10-CM | POA: Diagnosis not present

## 2021-10-05 DIAGNOSIS — K219 Gastro-esophageal reflux disease without esophagitis: Secondary | ICD-10-CM | POA: Diagnosis present

## 2021-10-05 DIAGNOSIS — E781 Pure hyperglyceridemia: Secondary | ICD-10-CM | POA: Diagnosis present

## 2021-10-05 DIAGNOSIS — E041 Nontoxic single thyroid nodule: Secondary | ICD-10-CM | POA: Diagnosis present

## 2021-10-05 DIAGNOSIS — Z881 Allergy status to other antibiotic agents status: Secondary | ICD-10-CM | POA: Diagnosis not present

## 2021-10-05 DIAGNOSIS — I495 Sick sinus syndrome: Secondary | ICD-10-CM | POA: Diagnosis present

## 2021-10-05 DIAGNOSIS — Z85118 Personal history of other malignant neoplasm of bronchus and lung: Secondary | ICD-10-CM | POA: Diagnosis not present

## 2021-10-05 DIAGNOSIS — I2510 Atherosclerotic heart disease of native coronary artery without angina pectoris: Secondary | ICD-10-CM | POA: Diagnosis present

## 2021-10-05 DIAGNOSIS — Z951 Presence of aortocoronary bypass graft: Secondary | ICD-10-CM

## 2021-10-05 DIAGNOSIS — Z87442 Personal history of urinary calculi: Secondary | ICD-10-CM | POA: Diagnosis not present

## 2021-10-05 DIAGNOSIS — Z87891 Personal history of nicotine dependence: Secondary | ICD-10-CM | POA: Diagnosis not present

## 2021-10-05 DIAGNOSIS — R7989 Other specified abnormal findings of blood chemistry: Secondary | ICD-10-CM | POA: Diagnosis not present

## 2021-10-05 DIAGNOSIS — Z79899 Other long term (current) drug therapy: Secondary | ICD-10-CM | POA: Diagnosis not present

## 2021-10-05 DIAGNOSIS — Z902 Acquired absence of lung [part of]: Secondary | ICD-10-CM | POA: Diagnosis not present

## 2021-10-05 DIAGNOSIS — Z888 Allergy status to other drugs, medicaments and biological substances status: Secondary | ICD-10-CM | POA: Diagnosis not present

## 2021-10-05 DIAGNOSIS — I160 Hypertensive urgency: Secondary | ICD-10-CM | POA: Diagnosis present

## 2021-10-05 DIAGNOSIS — G629 Polyneuropathy, unspecified: Secondary | ICD-10-CM | POA: Diagnosis present

## 2021-10-05 DIAGNOSIS — I272 Pulmonary hypertension, unspecified: Secondary | ICD-10-CM | POA: Diagnosis present

## 2021-10-05 DIAGNOSIS — Z950 Presence of cardiac pacemaker: Secondary | ICD-10-CM | POA: Diagnosis not present

## 2021-10-05 DIAGNOSIS — R42 Dizziness and giddiness: Secondary | ICD-10-CM | POA: Diagnosis not present

## 2021-10-05 DIAGNOSIS — N400 Enlarged prostate without lower urinary tract symptoms: Secondary | ICD-10-CM | POA: Diagnosis present

## 2021-10-05 LAB — ECHOCARDIOGRAM COMPLETE
AR max vel: 2.22 cm2
AV Area VTI: 2.4 cm2
AV Area mean vel: 2.42 cm2
AV Mean grad: 3 mmHg
AV Peak grad: 6 mmHg
Ao pk vel: 1.22 m/s
Area-P 1/2: 3.13 cm2
Height: 67 in
MV VTI: 2.57 cm2
S' Lateral: 1.77 cm
Weight: 2800 oz

## 2021-10-05 LAB — LIPID PANEL
Cholesterol: 137 mg/dL (ref 0–200)
HDL: 38 mg/dL — ABNORMAL LOW (ref >40)
LDL Cholesterol: 58 mg/dL (ref 0–99)
Total CHOL/HDL Ratio: 3.6 RATIO
Triglycerides: 204 mg/dL — ABNORMAL HIGH (ref <150)
VLDL: 41 mg/dL — ABNORMAL HIGH (ref 0–40)

## 2021-10-05 LAB — HEMOGLOBIN A1C
Hgb A1c MFr Bld: 5.5 % (ref 4.8–5.6)
Mean Plasma Glucose: 111.15 mg/dL

## 2021-10-05 LAB — PROTIME-INR
INR: 1.7 — ABNORMAL HIGH (ref 0.8–1.2)
Prothrombin Time: 20.3 seconds — ABNORMAL HIGH (ref 11.4–15.2)

## 2021-10-05 LAB — T4, FREE: Free T4: 1.06 ng/dL (ref 0.61–1.12)

## 2021-10-05 LAB — TSH: TSH: 3.147 u[IU]/mL (ref 0.350–4.500)

## 2021-10-05 MED ORDER — SODIUM CHLORIDE 0.9 % IV BOLUS
500.0000 mL | Freq: Once | INTRAVENOUS | Status: AC
  Administered 2021-10-05: 500 mL via INTRAVENOUS

## 2021-10-05 MED ORDER — SOTALOL HCL 80 MG PO TABS
80.0000 mg | ORAL_TABLET | Freq: Two times a day (BID) | ORAL | Status: DC
  Administered 2021-10-05 – 2021-10-07 (×5): 80 mg via ORAL
  Filled 2021-10-05 (×5): qty 1

## 2021-10-05 MED ORDER — WARFARIN SODIUM 7.5 MG PO TABS
7.5000 mg | ORAL_TABLET | Freq: Once | ORAL | Status: AC
  Administered 2021-10-05: 7.5 mg via ORAL
  Filled 2021-10-05: qty 1

## 2021-10-05 MED ORDER — SODIUM CHLORIDE 0.9 % IV SOLN: INTRAVENOUS | Status: DC

## 2021-10-05 NOTE — Progress Notes (Signed)
24hr urine collection started on 10/05/2021 ?At 1300. Per lab, this specific 24hr urine is not required to be on ice.  ?

## 2021-10-05 NOTE — Progress Notes (Signed)
Admission profile udpated ?

## 2021-10-05 NOTE — Consult Note (Signed)
ANTICOAGULATION CONSULT NOTE ? ?Pharmacy Consult for Warfarin ?Indication: atrial fibrillation ? ?Patient Measurements: ?Height: '5\' 7"'$  (170.2 cm) ?Weight: 79.4 kg (175 lb) ?IBW/kg (Calculated) : 66.1 ? ?Labs: ?Recent Labs  ?  10/04/21 ?1515 10/04/21 ?1712 10/04/21 ?1858 10/05/21 ?0506  ?HGB 16.0  --   --   --   ?HCT 46.6  --   --   --   ?PLT 249  --   --   --   ?LABPROT  --   --  23.3* 20.3*  ?INR  --   --  2.1* 1.7*  ?CREATININE 1.06  --   --   --   ?TROPONINIHS 6 6  --   --   ? ? ? ?Estimated Creatinine Clearance: 58 mL/min (by C-G formula based on SCr of 1.06 mg/dL). ? ? ?Medical History: ?Past Medical History:  ?Diagnosis Date  ? Angina of effort ([place]) 01/20/2014  ? Angina pectoris ([place]) 01/20/2014  ? Anticoagulation goal of INR 2 to 3 04/30/2017  ? Overview:  coumadin; CHADSvasc=3 age, htn, cad  ? 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 Atrial fibrillation ([place]) 01/20/2014  ? 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 CAD (coronary artery disease), native coronary artery 01/20/2014  ? 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 Overview:  [employer] Advisa  ? 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 Elevated prostate specific antigen (PSA) 01/24/2013  ? Gastroesophageal reflux disease 07/17/2014  ? History of biliary T-tube placement 03/04/2014  ? Overview:  Overview:  EF>55%, mild LVH, mod MR, mod TR, RVSP=51mHg TTE 8/26./2015  ? History of lung cancer 04/30/2017  ? Hyperglyceridemia 01/20/2014  ? Hypertension, benign 01/20/2014  ? Kidney stone 07/17/2014  ? 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 Paroxysmal atrial fibrillation ([place] 07/17/2014  ? 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 Personal history of malignant neoplasm of bronchus and lung 01/20/2014  ? Pre-syncope 07/17/2014  ? Presence of cardiac pacemaker 06/15/2014  ? Overview:  Overview:  [employer] Advisa  ? Pulmonary  hypertension ([place] 07/17/2014  ? Overview:  RVSP=462mG by TTE 03/04/2014  Overview:  Overview:  RVSP=4749m by TTE 03/04/2014  ? 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 Seasonal allergic rhinitis 07/17/2014  ? Overview:  Overview:  allergic rhinitis  ? SI (sacroiliac) joint dysfunction 08/10/2014  ? Sick sinus syndrome ([place]/02/2015  ? Overview:  Overview:  sinoatrial node dysfunction with dual chamber pacemaker in situ; paroxysmal AFib  ? Sinoatrial node dysfunction ([place]CC[place]/14/2015  ? Syncope and collapse 01/20/2014  ? ? ?Medications:  ?Warfarin 5 mg qSupper prior to admission. Last patient reported dose was 10/03/21 ? ?Assessment: ?Patient is a [age]o M with medical history as above and including Afib on warfarin who presented to the ED with hypertension and unsteadiness. Patient to be admitted for further observation. Pharmacy consult to assist with warfarin management while patient is in hospital. ? ?Goal of Therapy:  ?INR 2-3 ? ?Plan:  ?INR subtherapeutic: warfarin 7.5 mg po today ?Daily INR per protocol to guide dosing ?CBC at least every 3 days per protocol ? ?RodDallie Piles/29/2023,10:23 AM ? ? ?

## 2021-10-05 NOTE — Assessment & Plan Note (Addendum)
The patient was orthostatic during the entire hospital course.  Initially his blood pressure dropped into the 80s.  Upon discharge the last set of orthostatics showed the blood pressure dropped down from 170/76 lying down to 124/71 after standing for 3 minutes.  Prior to adding any antihypertensive medications, you must check orthostatics. ?

## 2021-10-05 NOTE — Assessment & Plan Note (Addendum)
Pharmacy dosing Coumadin.  INR 2.9 today. ?

## 2021-10-05 NOTE — Care Management Obs Status (Signed)
MEDICARE OBSERVATION STATUS NOTIFICATION ? ? ?Patient Details  ?Name: Wayne Marquez ?MRN: [id number] ?Date of Birth: 11/29/1940 ? ? ?Medicare Observation Status Notification Given:  Yes ? ? ? ?Shelbie Hutching, RN ?10/05/2021, 1:52 PM ?

## 2021-10-05 NOTE — Evaluation (Signed)
Occupational Therapy Evaluation ?Patient Details ?Name: Wayne Marquez ?MRN: [id number] ?DOB: 01/26/1941 ?Today's Date: 10/05/2021 ? ? ?History of Present Illness 81 y.o. 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  He has been compliant with his medication.  He denies one-sided weakness, numbness, tingling, facial droop, slurred speech or visual disturbance.  He denies chest pain or shortness of breath.  ? ?Clinical Impression ?  ?Upon entering the room, pt supine in bed with wife present in room and agreeable to OT evaluation. Pt reports being independent at baseline without use of AD for self care and IADLs. Pt enjoys hunting and cutting grass. Pt sitting on EOB while discussing home set up and BP taken with results of 121/71. Pt ambulates short distance in room and after several minutes BP taken with result of 109/59. Pt then ambulates without AD 100' without physical assistance. Pt reports ambulating at baseline level and no dizziness reported with mobility this session. Pt's BP taken once  in room and in standing with results of 106/67. RN and MD notified. Pt returning to hospital bed without assistance and HOB elevated. Pt with no skilled acute OT need at this time. OT to SIGN OFF.  ?   ? ?Recommendations for follow up therapy are one component of a multi-disciplinary discharge planning process, led by the attending physician.  Recommendations may be updated based on patient status, additional functional criteria and insurance authorization.  ? ?Follow Up Recommendations ? No OT follow up  ?  ?Assistance Recommended at Discharge None  ?   ?    ?Equipment Recommendations ? None recommended by OT  ?  ?   ?Precautions / Restrictions Precautions ?Precautions: Fall  ? ?  ? ?Mobility Bed Mobility ?Overal bed mobility: Independent ?  ?  ?  ?  ?  ?  ?  ?  ? ?Transfers ?Overall transfer level: Independent ?  ?  ?  ?  ?  ?  ?  ?  ?  ?  ? ?  ?Balance Overall balance assessment: Independent ?  ?  ?  ?  ?  ?  ?  ?  ?  ?  ?  ?  ?  ?  ?  ?  ?  ?  ?   ? ?ADL either performed or assessed with clinical judgement  ? ?ADL Overall ADL's : At baseline;Independent ?  ?  ?  ?  ?  ?  ?  ?  ?  ?  ?  ?  ?  ?  ?  ?  ?  ?  ?  ?   ? ? ? ?Vision Patient Visual Report: No change from baseline ?   ?   ?   ?   ? ?Pertinent Vitals/Pain Pain Assessment ?Pain Assessment: No/denies pain  ? ? ? ?Hand Dominance Right ?  ?Extremity/Trunk Assessment Upper Extremity Assessment ?Upper Extremity Assessment: Overall WFL for tasks assessed ?  ?Lower Extremity Assessment ?Lower Extremity Assessment: Overall WFL for tasks assessed ?  ?  ?  ?Communication Communication ?Communication: No difficulties ?  ?Cognition Arousal/Alertness: Awake/alert ?Behavior During Therapy: [facility] for tasks assessed/performed ?Overall Cognitive Status: Within Functional Limits for  tasks assessed ?  ?  ?  ?  ?  ?  ?  ?  ?  ?  ?  ?  ?  ?  ?  ?  ?  ?  ?  ?   ?   ?   ? ? ?Home Living Family/patient expects to be discharged to:: Private residence ?Living Arrangements: Spouse/significant other ?Available Help at Discharge: Family;Available 24 hours/day ?Type of Home: House ?Home Access: Stairs to enter ?Entrance Stairs-Number of Steps: 1 ?  ?Home Layout: One level ?  ?  ?Bathroom Shower/Tub: Tub/shower unit ?  ?Bathroom Toilet: Standard ?  ?  ?Home Equipment: None ?  ?  ?  ? ?  ?Prior Functioning/Environment Prior Level of Function : Independent/Modified Independent ?  ?  ?  ?  ?  ?  ?  ?  ?  ? ?  ?  ?   ?   ?   ?OT Goals(Current goals can be found in the care plan section) Acute Rehab OT Goals ?Patient Stated Goal: to go home ?OT  Goal Formulation: With patient/family ?Time For Goal Achievement: 10/05/21 ?Potential to Achieve Goals: Good  ?OT Frequency:   ?  ? ?   ?AM-PAC OT "6 Clicks" Daily Activity     ?Outcome Measure Help from another person eating meals?: None ?Help from another person taking care of personal grooming?: None ?Help from another person toileting, which includes using toliet, bedpan, or urinal?: None ?Help from another person bathing (including washing, rinsing, drying)?: None ?Help from another person to put on and taking off regular upper body clothing?: None ?Help from another person to put on and taking off regular lower body clothing?: None ?6 Click Score: 24 ?  ?End of Session Nurse Communication: Mobility status;Other (comment) (BP) ? ?Activity Tolerance: Patient tolerated treatment well ?Patient left: in bed;with family/visitor present ? ?   ?              ?Time: 0940-1000 ?OT Time Calculation (min): 20 min ?Charges:  OT General Charges ?$OT Visit: 1 Visit ?OT Evaluation ?$OT Eval Low Complexity: 1 Low ?OT Treatments ?$Therapeutic Activity: 8-22 mins ? ?Darleen Crocker, [place], OTR/L , CBIS ?ascom [phone]  ?10/05/21, 12:27 PM  ?

## 2021-10-05 NOTE — Progress Notes (Signed)
*  PRELIMINARY RESULTS* ?Echocardiogram ?2D Echocardiogram has been performed. ? ?Wayne Marquez ?10/05/2021, 8:54 AM ?

## 2021-10-05 NOTE — TOC Initial Note (Signed)
Transition of Care (TOC) - Initial/Assessment Note  ? ? ?Patient Details  ?Name: Wayne Marquez ?MRN: [id number] ?Date of Birth: Jun 12, 1941 ? ?Transition of Care (TOC) CM/SW Contact:    ?Shelbie Hutching, RN ?Phone Number: ?10/05/2021, 2:02 PM ? ?Clinical Narrative:                 ? ?Transition of Care (TOC) Screening Note ? ? ?Patient Details  ?Name: Wayne Marquez ?Date of Birth: Sep 02, 1940 ? ? ?Transition of Care (TOC) CM/SW Contact:    ?Shelbie Hutching, RN ?Phone Number: ?10/05/2021, 2:02 PM ? ? ? ?Transition of Care Department [facility]) has reviewed patient and no TOC needs have been identified at this time. We will continue to monitor patient advancement through interdisciplinary progression rounds. If new patient transition needs arise, please place a TOC consult. ?  ?From home with wife, independent.  Wife will pick up at DC. ? ?Expected Discharge Plan: Home/Self Care ?Barriers to Discharge: Continued Medical Work up ? ? ?Patient Goals and CMS Choice ?  ?  ?  ? ?Expected Discharge Plan and Services ?Expected Discharge Plan: Home/Self Care ?  ?  ?  ?Living arrangements for the past 2 months: [place] ?                ?  ?  ?  ?  ?  ?  ?  ?  ?  ?  ? ?Prior Living Arrangements/Services ?Living arrangements for the past 2 months: [place] ?Lives with:: Spouse ?  ?       ?  ?  ?  ?  ? ?Activities of Daily Living ?Home Assistive Devices/Equipment: Hearing aid, Dentures (specify type), Eyeglasses ?ADL Screening (condition at time of admission) ?Patient's cognitive ability adequate to safely complete daily activities?: Yes ?Is the patient deaf or have difficulty hearing?: No ?Does the patient have difficulty seeing, even when wearing glasses/contacts?: No ?Does the patient have difficulty concentrating, remembering, or making decisions?: No ?Patient able to express need for assistance with ADLs?: Yes ?Does the patient have difficulty dressing or bathing?: No ?Independently performs ADLs?: Yes  (appropriate for developmental age) ?Does the patient have difficulty walking or climbing stairs?: No ?Weakness of Legs: None ?Weakness of Arms/Hands: None ? ?Permission Sought/Granted ?  ?  ?   ?   ?   ?   ? ?Emotional Assessment ?Appearance:: Appears stated age ?Attitude/Demeanor/Rapport: Engaged ?Affect (typically observed): Accepting, Pleasant ?Orientation: : Oriented to Self, Oriented to Place, Oriented to  Time, Oriented to Situation ?Alcohol / Substance Use: Not Applicable ?Psych Involvement: No (comment) ? ?Admission diagnosis:  Altered mental status [R41.82] ?Orthostatic hypotension [I95.1] ?Patient Active Problem List  ? Diagnosis Date Noted  ? Orthostatic hypotension 10/05/2021  ? Thyroid nodule 10/04/2021  ? 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 Hydropneumothorax 10/07/2017  ? AKI (acute kidney injury) ([place]) 10/06/2017  ? Leukocytosis 10/06/2017  ? Acute blood loss as cause of postoperative anemia 10/05/2017  ? Chronic anticoagulation 10/05/2017  ? Acute postoperative pain 10/04/2017  ? S/P coronary artery bypass graft x 3 10/04/2017  ? CHF (congestive heart failure), NYHA class III, chronic, diastolic ([place]) 14/48/1856  ? Red blood cell antibody positive 10/03/2017  ? Coronary atherosclerosis due to lipid rich plaque 10/02/2017  ? BPH (benign prostatic hyperplasia) 04/30/2017  ? GERD (gastroesophageal reflux disease) 04/30/2017  ? High risk medication use 04/30/2017  ? History of lung cancer 04/30/2017  ? 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 Other and unspecified angina  pectoris 04/30/2017  ? Seasonal allergies 04/30/2017  ? SSS (sick sinus syndrome) ([place]) 04/30/2017  ? Syncope, near 04/30/2017  ? 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 SI (sacroiliac) joint dysfunction 08/10/2014  ? 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 Kidney stone 07/17/2014  ? Encounter for therapeutic drug monitoring 07/17/2014  ? Fitting or adjustment of cardiac pacemaker 07/17/2014  ?  Gastroesophageal reflux disease 07/17/2014  ? History of malignant neoplasm 07/17/2014  ? Hypertriglyceridemia 07/17/2014  ? Paroxysmal atrial fibrillation ([place]) 07/17/2014  ? Polypharmacy 07/17/2014  ? Pre-syncope 07/17/2014  ? Pulmonary hypertension ([place]) 07/17/2014  ? 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 Seasonal allergic rhinitis 07/17/2014  ? Sick sinus syndrome ([place]) 07/17/2014  ? Cardiac pacemaker with history of SSS 06/15/2014  ? Presence of cardiac pacemaker 06/15/2014  ? H/O echocardiogram 03/04/2014  ? History of biliary T-tube placement 03/04/2014  ? Angina of effort ([place]) 01/20/2014  ? Angina pectoris ([place]) 01/20/2014  ? Atrial fibrillation ([place]) 01/20/2014  ? Neck pain 01/20/2014  ? CAD (coronary artery disease), native coronary artery 01/20/2014  ? Esophageal reflux 01/20/2014  ? Hyperglyceridemia 01/20/2014  ? Hypertension, benign 01/20/2014  ? Personal history of malignant neoplasm of bronchus and lung 01/20/2014  ? Sinoatrial node dysfunction (HCC) 01/20/2014  ? Syncope and collapse 01/20/2014  ? 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 Elevated prostate specific antigen (PSA) 01/24/2013  ? ?PCP:  Renee Rival, NP ?Pharmacy:   ?[facility] [place] ?[address] ?[place] [address] ?Phone: [phone] Fax: [phone] ? ?[facility] [place] ?[address] ?[place] [address] ?Phone: [phone] Fax: [phone] ? ? ? ? ?Social Determinants of Health (SDOH) Interventions ?  ? ?Readmission Risk Interventions ?   ? View : No data to display.  ?  ?  ?  ? ? ? ?

## 2021-10-05 NOTE — Progress Notes (Signed)
?  Progress Note ? ? ?Patient: Wayne Marquez VOH:[id number] DOB: Dec 18, 1940 DOA: 10/04/2021     0 ?DOS: the patient was seen and examined on 10/05/2021 ? ? ?Assessment and Plan: ?Orthostatic hypotension ?The patient had orthostatic hypotension with blood pressure dropping down into the 80s.  I ordered for a fluid bolus and IV fluids.  I held his Cozaar and Flomax currently.  The patient does have history of hypertension and has had blood pressures that go up and he takes as needed Norvasc.  Holding that also.  I will send off a 24-hour urine for metanephrines and check an a.m. cortisol. ? ?Paroxysmal atrial fibrillation ([place]) ?Pharmacy dosing Coumadin.  INR little low at 1.7.  Restart sotalol.  Holding metoprolol. ? ?BPH (benign prostatic hyperplasia) ?Continue Proscar.  Hold Flomax with orthostatic hypotension today.  May need to restart if has urinary issues. ? ?S/P coronary artery bypass graft x 3 ?Patient on Crestor. ? ?Thyroid nodule ?Thyroid nodule seen on CT scan.  Will need outpatient follow-up for this.  TSH and free T4 in the normal range. ? ?Personal history of malignant neoplasm of bronchus and lung ?Follow-up as outpatient ? ? ? ? ?  ? ?Subjective: Patient coming in with dizziness and elevated blood pressure.  Today had orthostatic hypotension.  Patient states blood pressure has been up and down a lot recently. ? ?Physical Exam: ?Vitals:  ? 10/05/21 0700 10/05/21 0900 10/05/21 1000 10/05/21 1300  ?BP: 126/82 137/64 (!) 151/74 (!) 164/74  ?Pulse: 63 63 66 63  ?Resp: (!) 22 20 (!) 25 18  ?Temp:      ?TempSrc:      ?SpO2: 98% 96% 99% 98%  ?Weight:      ?Height:      ? ?Physical Exam ?HENT:  ?   Head: Normocephalic.  ?   Mouth/Throat:  ?   Pharynx: No oropharyngeal exudate.  ?Eyes:  ?   General: Lids are normal.  ?   Conjunctiva/sclera: Conjunctivae normal.  ?Cardiovascular:  ?   Rate and Rhythm: Normal rate and regular rhythm.  ?   Heart sounds: Normal heart sounds, S1 normal and S2 normal.  ?Pulmonary:  ?    Breath sounds: Normal breath sounds. No decreased breath sounds, wheezing, rhonchi or rales.  ?Abdominal:  ?   Palpations: Abdomen is soft.  ?   Tenderness: There is no abdominal tenderness.  ?Musculoskeletal:  ?   Right lower leg: No swelling.  ?   Left lower leg: No swelling.  ?Skin: ?   General: Skin is warm.  ?   Findings: No rash.  ?Neurological:  ?   Mental Status: He is alert and oriented to person, place, and time.  ?   Comments: Power 5 out of 5 upper and lower extremities.  ?  ?Data Reviewed: ?Echocardiogram shows a normal EF.  Patient orthostatic with vital signs today.  White blood cell count 15.1.  Creatinine 1.06.  Thyroid function normal range.  LDL 58 and triglycerides 204. ? ? ?Family Communication: Wife at bedside ? ?Disposition: ?Status is: Observation ?Patient dropped his blood pressure into the 80s with standing today.  We will continue to watch in the hospital. ? ?Planned Discharge Destination: Home ? ? ?Author: ?Loletha Grayer, MD ?10/05/2021 1:52 PM ? ?For on call review [email or web address].  ?

## 2021-10-05 NOTE — Progress Notes (Signed)
SLP Cancellation Note ? ?Patient Details ?Name: Wayne Marquez ?MRN: [id number] ?DOB: 23-Dec-1940 ? ? ?Cancelled treatment:       Reason Eval/Treat Not Completed: SLP screened, no needs identified, will sign off (chart reviewed; consulted NSG then met w/ pt/Wife) ?Pt denied any difficulty swallowing and is currently on a regular diet; tolerates swallowing pills w/ water per NSG. Pt conversed in conversation w/ this therapist and Wife w/out expressive/receptive deficits noted; pt denied any speech-language deficits. Speech clear. No deficits noted by OT. ?No further skilled ST services indicated as pt appears at his baseline. Pt agreed. NSG to reconsult if any change in status while admitted.   ? ? ? ? ? ?Orinda Kenner, MS, CCC-SLP ?Speech Language Pathologist ?Rehab Services; [place] ?[phone] (ascom) ?Ursala Cressy ?10/05/2021, 10:15 AM ?

## 2021-10-05 NOTE — Consult Note (Signed)
Neurology Consultation ?Reason for Consult: Dizziness ?Referring Physician: Earleen Newport, R ? ?CC: Dizziness ? ?History is obtained from: Patient ? ?HPI: Wayne Marquez is a 81 y.o. male with a history of orthostatic hypotension, atrial fibrillation on anticoagulation who presents with dizziness.  He states that it is better with laying down, worse with sitting up, and very bad with standing.  He has had longstanding problems and has been on midodrine in the past, though is not sure if he is taking it currently.  ? ?He denies true vertigo, but does complain of dysequilibrium with standing. He denies focal weakness, diplopia or other symptoms.  ? ? ?ROS: A 14 point ROS was performed and is negative except as noted in the HPI. .  ? ?Past Medical History:  ?Diagnosis Date  ? Angina of effort ([place]) 01/20/2014  ? Angina pectoris ([place]) 01/20/2014  ? Anticoagulation goal of INR 2 to 3 04/30/2017  ? Overview:  coumadin; CHADSvasc=3 age, htn, cad  ? 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 Atrial fibrillation ([place]) 01/20/2014  ? 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 CAD (coronary artery disease), native coronary artery 01/20/2014  ? 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 Overview:  [employer] Advisa  ? 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 Elevated prostate specific antigen (PSA) 01/24/2013  ? Gastroesophageal reflux disease 07/17/2014  ? History of biliary T-tube placement 03/04/2014  ? Overview:  Overview:  EF>55%, mild LVH, mod MR, mod TR, RVSP=53mHg TTE 8/26./2015  ? History of lung cancer 04/30/2017  ? Hyperglyceridemia 01/20/2014  ? Hypertension, benign 01/20/2014  ? Kidney stone 07/17/2014  ? 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 Paroxysmal atrial fibrillation ([place] 07/17/2014  ? 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 Personal history of malignant neoplasm of bronchus and lung  01/20/2014  ? Pre-syncope 07/17/2014  ? Presence of cardiac pacemaker 06/15/2014  ? Overview:  Overview:  [employer] Advisa  ? Pulmonary hypertension ([place] 07/17/2014  ? Overview:  RVSP=49mG by TTE 03/04/2014  Overview:  Overview:  RVSP=4713m by TTE 03/04/2014  ? 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 Seasonal allergic rhinitis 07/17/2014  ? Overview:  Overview:  allergic rhinitis  ? SI (sacroiliac) joint dysfunction 08/10/2014  ? Sick sinus syndrome ([place]/02/2015  ? Overview:  Overview:  sinoatrial node dysfunction with dual chamber pacemaker in situ; paroxysmal AFib  ? Sinoatrial node dysfunction ([place]/14/2015  ? Syncope and collapse 01/20/2014  ? ? ? ?Family History  ?Problem Relation Age of Onset  ? Bladder Cancer Neg Hx   ? Kidney cancer Neg Hx   ? Prostate cancer Neg Hx   ? ? ? ?Social History:  reports that he has quit smoking. He has quit using smokeless tobacco. He reports that he does not drink alcohol and does not use drugs. ? ? ?Exam: ?Current vital signs: ?BP (!) 164/74   Pulse 63   Temp 97.7 ?F (36.5 ?C) (Oral)   Resp 18   Ht '5\' 7"'$  (1.702 m)   Wt 79.4 kg   SpO2 98%   BMI 27.41 kg/m?  ?Vital signs in last 24 hours: ?Temp:  [97.7 ?F (36.5 ?C)] 97.7 ?F (36.5 ?C) (03/28 1513) ?Pulse Rate:  [59-73] 63 (03/29 1300) ?Resp:  [14-25] 18 (03/29 1300) ?BP: (126-185)/(64-88) 164/74 (03/29 1300) ?SpO2:  [94 %-99 %] 98 % (03/29 1300) ?Weight:  [79.4 kg] 79.4 kg (03/28 1510) ? ? ?  Physical Exam  ?Constitutional: Appears well-developed and well-nourished.  ?Psych: Affect appropriate to situation ?Eyes: No scleral injection ?HENT: No OP obstruction ?MSK: no joint deformities.  ?Cardiovascular: Normal rate and regular rhythm.  ?Respiratory: Effort normal, non-labored breathing ?GI: Soft.  No distension. There is no tenderness.  ?Skin: WDI ? ?Neuro: ?Mental Status: ?Patient is awake, alert, oriented to person, place, month, year, and situation. ?Patient is able to give a clear and coherent history. ?No signs of aphasia or  neglect ?Cranial Nerves: ?II: Visual Fields are full. Pupils are equal, round, and reactive to light.   ?III,IV, VI: EOMI without ptosis or diploplia.  ?V: Facial sensation is symmetric to temperature ?VII: Facial movement is symmetric.  ?VIII: hearing is intact to voice ?X: Uvula elevates symmetrically ?XI: Shoulder shrug is symmetric. ?XII: tongue is midline without atrophy or fasciculations.  ?Motor: ?Tone is normal. Bulk is normal. 5/5 strength was present in all four extremities.  ?Sensory: ?Sensation is symmetric to light touch and temperature in the arms and legs. ?Deep Tendon Reflexes: ?2+ and symmetric in the biceps and patellae.  ?Cerebellar: ?FNF and HKS are intact bilaterally ? ? ? ? ? ?I have reviewed labs in epic and the results pertinent to this consultation are: ?Cr 1.06 ? ?I have reviewed the images obtained: CT head - negative, CTA - vert occlusion ? ?Impression: 81 yo M with dizziness that is most consistent with orthostasis and is found to be markedly orthostatic on exam. This has been a chronic problem since his heart surgery and I suspect this is an acute-on-chronic complaint rather than something new. Treatment will be symptomatic. He is already anticoagulated, an I do not think an MRI would be at all likley to change managemetn at this point.   ? ?Recommendations: ?1) management of orthostasis per IM ?2) no need for MRI brain ? ? ?Roland Rack, MD ?[facility] ?[phone] ? ?If 7pm- 7am, please page neurology on call as listed in [place]. ? ?

## 2021-10-05 NOTE — Progress Notes (Signed)
PT Cancellation Note ? ?Patient Details ?Name: Wayne Marquez ?MRN: [id number] ?DOB: April 21, 1941 ? ? ?Cancelled Treatment:    Reason Eval/Treat Not Completed: PT screened, no needs identified, will sign off ?OT saw pt earlier this AM, he was able to walk >120f w/o AD and overall did well.  Apart from some dizziness he apparently did very well and feels he is at/near his baseline.  He did not feel the need to have formal PT eval and per conversation with OT this seems reasonable; also briefly discussed this with attending MD.  Should he have a change in function and need a formal PT eval please re-place PT order.   ?GKreg Shropshire DPT ?10/05/2021, 12:56 PM ?

## 2021-10-05 NOTE — ED Notes (Signed)
Discussed pt with hospitalitis. Pt admitted following work up for dizziness. MRI pending but unable to complete here in [facility]. Pt will require transfer for [place] hospital d/t pacemaker. No new orders.  ?

## 2021-10-06 DIAGNOSIS — R7989 Other specified abnormal findings of blood chemistry: Secondary | ICD-10-CM | POA: Diagnosis not present

## 2021-10-06 DIAGNOSIS — R208 Other disturbances of skin sensation: Secondary | ICD-10-CM

## 2021-10-06 DIAGNOSIS — I951 Orthostatic hypotension: Secondary | ICD-10-CM | POA: Diagnosis not present

## 2021-10-06 DIAGNOSIS — I480 Paroxysmal atrial fibrillation: Secondary | ICD-10-CM | POA: Diagnosis not present

## 2021-10-06 DIAGNOSIS — N401 Enlarged prostate with lower urinary tract symptoms: Secondary | ICD-10-CM | POA: Diagnosis not present

## 2021-10-06 LAB — PROTIME-INR
INR: 2.3 — ABNORMAL HIGH (ref 0.8–1.2)
Prothrombin Time: 25 seconds — ABNORMAL HIGH (ref 11.4–15.2)

## 2021-10-06 LAB — CBC
HCT: 42.4 % (ref 39.0–52.0)
Hemoglobin: 14.7 g/dL (ref 13.0–17.0)
MCH: 30.9 pg (ref 26.0–34.0)
MCHC: 34.7 g/dL (ref 30.0–36.0)
MCV: 89.3 fL (ref 80.0–100.0)
Platelets: 204 10*3/uL (ref 150–400)
RBC: 4.75 MIL/uL (ref 4.22–5.81)
RDW: 13.4 % (ref 11.5–15.5)
WBC: 10.3 10*3/uL (ref 4.0–10.5)
nRBC: 0 % (ref 0.0–0.2)

## 2021-10-06 LAB — BASIC METABOLIC PANEL
Anion gap: 6 (ref 5–15)
BUN: 16 mg/dL (ref 8–23)
CO2: 25 mmol/L (ref 22–32)
Calcium: 8.4 mg/dL — ABNORMAL LOW (ref 8.9–10.3)
Chloride: 110 mmol/L (ref 98–111)
Creatinine, Ser: 0.88 mg/dL (ref 0.61–1.24)
GFR, Estimated: >60 mL/min (ref >60)
Glucose, Bld: 94 mg/dL (ref 70–99)
Potassium: 3.7 mmol/L (ref 3.5–5.1)
Sodium: 141 mmol/L (ref 135–145)

## 2021-10-06 LAB — CORTISOL-AM, BLOOD: Cortisol - AM: 4 ug/dL — ABNORMAL LOW (ref 6.7–22.6)

## 2021-10-06 MED ORDER — WARFARIN SODIUM 5 MG PO TABS
5.0000 mg | ORAL_TABLET | Freq: Once | ORAL | Status: AC
  Administered 2021-10-06: 5 mg via ORAL
  Filled 2021-10-06: qty 1

## 2021-10-06 MED ORDER — COSYNTROPIN 0.25 MG IJ SOLR
0.2500 mg | Freq: Once | INTRAMUSCULAR | Status: AC
  Administered 2021-10-07: 0.25 mg via INTRAVENOUS
  Filled 2021-10-06: qty 0.25

## 2021-10-06 NOTE — Assessment & Plan Note (Addendum)
Likely neuropathy but did not want to start new medications for this.  I did check a B12 level which was low so this could be secondary to vitamin B12 deficiency.  Advised taking over-the-counter vitamin B12 1000 mcg daily.  (This result came in after the patient was discharged, I called the patient's wife to let her know to add the B12 over-the-counter) ?

## 2021-10-06 NOTE — Progress Notes (Addendum)
?  Progress Note ? ? ?Patient: Wayne Marquez IRJ:[id number] DOB: Apr 17, 1941 DOA: 10/04/2021     1 ?DOS: the patient was seen and examined on 10/06/2021 ?  ? ? ?Assessment and Plan: ?Orthostatic hypotension ?The patient still has orthostatic hypotension with blood pressure dropping down to 89/64 with standing. I am holding all antihypertensive medication.  Continue IV fluids.  With cortisol level being borderline low I will check a cosyntropin test tomorrow morning to screen for adrenal insufficiency.  Collecting 24-hour urine for metanephrines. ? ?Low serum cortisol level ?A.m. cortisol low at 4.0.  We will need to check a cosyntropin test to see if adrenally insufficient. ? ?Paroxysmal atrial fibrillation ([place]) ?Pharmacy dosing Coumadin.  INR 2.3 today. ? ?BPH (benign prostatic hyperplasia) ?Continue Proscar.  Hold Flomax with orthostatic hypotension today.  May need to restart if has urinary issues. ? ?Burning sensation of feet ?Likely secondary to neuropathy.  We will check a vitamin B12 level.  Would rather hold off on medications at this point since patient orthostatic currently. ? ?S/P coronary artery bypass graft x 3 ?Patient on Crestor. ? ?Thyroid nodule ?Thyroid nodule seen on CT scan.  Will need outpatient follow-up for this.  TSH and free T4 in the normal range. ? ?Personal history of malignant neoplasm of bronchus and lung ?Follow-up as outpatient ? ? ? ? ?  ? ?Subjective: Patient still feels a little swimmy headed when standing up but a little less than yesterday.  Feels tired all the time.  Has burning in his feet going on for 1-1/2 years. ? ?Physical Exam: With standing blood pressure dropped down to 89/64, temperature 97.3, pulse 67 ?Physical Exam ?HENT:  ?   Head: Normocephalic.  ?   Mouth/Throat:  ?   Pharynx: No oropharyngeal exudate.  ?Eyes:  ?   General: Lids are normal.  ?   Conjunctiva/sclera: Conjunctivae normal.  ?Cardiovascular:  ?   Rate and Rhythm: Normal rate and regular rhythm.  ?    Heart sounds: Normal heart sounds, S1 normal and S2 normal.  ?Pulmonary:  ?   Breath sounds: Normal breath sounds. No decreased breath sounds, wheezing, rhonchi or rales.  ?Abdominal:  ?   Palpations: Abdomen is soft.  ?   Tenderness: There is no abdominal tenderness.  ?Musculoskeletal:  ?   Right lower leg: No swelling.  ?   Left lower leg: No swelling.  ?Skin: ?   General: Skin is warm.  ?   Findings: No rash.  ?Neurological:  ?   Mental Status: He is alert and oriented to person, place, and time.  ?   Comments: Power 5 out of 5 upper and lower extremities.  ?  ? ?Data Reviewed: ?Patient still orthostatic with vital signs today.  A.m. cortisol 4.0.  Echocardiogram reviewed with normal EF. ? ?Family Communication: Spoke with wife at the bedside ? ?Disposition: ?Status is: Inpatient ?Remains inpatient appropriate because: Still orthostatic today ? ?Planned Discharge Destination: Home ? ? ?Author: ?Loletha Grayer, MD ?10/06/2021 12:45 PM ? ?For on call review [email or web address].  ?

## 2021-10-06 NOTE — Assessment & Plan Note (Deleted)
A.m. cortisol low at 4.0.  We will need to check a cosyntropin test to see if adrenally insufficient. ?

## 2021-10-06 NOTE — Consult Note (Signed)
ANTICOAGULATION CONSULT NOTE ? ?Pharmacy Consult for Warfarin ?Indication: atrial fibrillation ? ?Patient Measurements: ?Height: '5\' 7"'$  (170.2 cm) ?Weight: 79.4 kg (175 lb) ?IBW/kg (Calculated) : 66.1 ? ?Labs: ?Recent Labs  ?  10/04/21 ?1515 10/04/21 ?1712 10/04/21 ?1858 10/05/21 ?9892 10/06/21 ?1194  ?HGB 16.0  --   --   --  14.7  ?HCT 46.6  --   --   --  42.4  ?PLT 249  --   --   --  204  ?LABPROT  --   --  23.3* 20.3* 25.0*  ?INR  --   --  2.1* 1.7* 2.3*  ?CREATININE 1.06  --   --   --  0.88  ?TROPONINIHS 6 6  --   --   --   ? ? ? ?Estimated Creatinine Clearance: 69.9 mL/min (by C-G formula based on SCr of 0.88 mg/dL). ? ? ?Medical History: ?Past Medical History:  ?Diagnosis Date  ? Angina of effort ([place]) 01/20/2014  ? Angina pectoris ([place]) 01/20/2014  ? Anticoagulation goal of INR 2 to 3 04/30/2017  ? Overview:  coumadin; CHADSvasc=3 age, htn, cad  ? 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 Atrial fibrillation ([place]) 01/20/2014  ? 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 CAD (coronary artery disease), native coronary artery 01/20/2014  ? 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 Overview:  [employer] Advisa  ? 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 Elevated prostate specific antigen (PSA) 01/24/2013  ? Gastroesophageal reflux disease 07/17/2014  ? History of biliary T-tube placement 03/04/2014  ? Overview:  Overview:  EF>55%, mild LVH, mod MR, mod TR, RVSP=9mHg TTE 8/26./2015  ? History of lung cancer 04/30/2017  ? Hyperglyceridemia 01/20/2014  ? Hypertension, benign 01/20/2014  ? Kidney stone 07/17/2014  ? 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 Paroxysmal atrial fibrillation ([place] 07/17/2014  ? 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 Personal history of malignant neoplasm of bronchus and lung 01/20/2014  ? Pre-syncope 07/17/2014  ? Presence of cardiac pacemaker 06/15/2014  ? Overview:   Overview:  [employer] Advisa  ? Pulmonary hypertension ([place] 07/17/2014  ? Overview:  RVSP=452mG by TTE 03/04/2014  Overview:  Overview:  RVSP=4745m by TTE 03/04/2014  ? 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 Seasonal allergic rhinitis 07/17/2014  ? Overview:  Overview:  allergic rhinitis  ? SI (sacroiliac) joint dysfunction 08/10/2014  ? Sick sinus syndrome ([facility]/02/2015  ? Overview:  Overview:  sinoatrial node dysfunction with dual chamber pacemaker in situ; paroxysmal AFib  ? Sinoatrial node dysfunction ([facility]/14/2015  ? Syncope and collapse 01/20/2014  ? ? ?Medications:  ?Warfarin 5 mg qSupper prior to admission. Last patient reported dose was 10/03/21 ? ?Assessment: ?Patient is a [age] M with medical history as above and including Afib on warfarin who presented to the ED with hypertension and unsteadiness. Patient to be admitted for further observation. Pharmacy consult to assist with warfarin management while patient is in hospital. ? ?Goal of Therapy:  ?INR 2-3 ? ?Plan:  ?INR therapeutic: warfarin 5 mg po today (home dose) ?Daily INR per protocol to guide dosing ?CBC at least every 3 days per protocol ? ?RodDallie Piles/30/2023,7:34 AM ? ? ?

## 2021-10-07 DIAGNOSIS — I480 Paroxysmal atrial fibrillation: Secondary | ICD-10-CM | POA: Diagnosis not present

## 2021-10-07 DIAGNOSIS — N401 Enlarged prostate with lower urinary tract symptoms: Secondary | ICD-10-CM | POA: Diagnosis not present

## 2021-10-07 DIAGNOSIS — R208 Other disturbances of skin sensation: Secondary | ICD-10-CM

## 2021-10-07 DIAGNOSIS — E2740 Unspecified adrenocortical insufficiency: Secondary | ICD-10-CM

## 2021-10-07 DIAGNOSIS — I951 Orthostatic hypotension: Secondary | ICD-10-CM | POA: Diagnosis not present

## 2021-10-07 DIAGNOSIS — E538 Deficiency of other specified B group vitamins: Secondary | ICD-10-CM

## 2021-10-07 LAB — PROTIME-INR
INR: 2.9 — ABNORMAL HIGH (ref 0.8–1.2)
Prothrombin Time: 30.6 seconds — ABNORMAL HIGH (ref 11.4–15.2)

## 2021-10-07 LAB — VITAMIN B12: Vitamin B-12: 179 pg/mL — ABNORMAL LOW (ref 180–914)

## 2021-10-07 LAB — ACTH STIMULATION, 3 TIME POINTS
Cortisol, 30 Min: 14.2 ug/dL
Cortisol, 60 Min: 17.3 ug/dL
Cortisol, Base: 5.4 ug/dL

## 2021-10-07 MED ORDER — HYDROCORTISONE 10 MG PO TABS: ORAL_TABLET | ORAL | 0 refills | Status: DC

## 2021-10-07 MED ORDER — HYDROCORTISONE 10 MG PO TABS
20.0000 mg | ORAL_TABLET | ORAL | Status: AC
  Administered 2021-10-07: 20 mg via ORAL
  Filled 2021-10-07: qty 2

## 2021-10-07 NOTE — Assessment & Plan Note (Signed)
The patient had a poor response to the cosyntropin stimulation test so diagnosis of adrenal insufficiency was made.  I will start Cortef 20 mg in the morning at 8 AM and 10 mg in the p.m. at 3 PM.  The ACTH is still pending which will help determine whether this is primary or secondary adrenal insufficiency.  Recommend endocrine referral as outpatient. ?

## 2021-10-07 NOTE — Progress Notes (Signed)
Mobility Specialist - Progress Note ? ? ? 10/07/21 1400  ?Mobility  ?Activity Ambulated independently in hallway;Stood at bedside;Dangled on edge of bed  ?Level of Assistance Independent  ?Assistive Device Front wheel walker  ?Distance Ambulated (ft) 320 ft  ?Activity Response Tolerated well  ?$Mobility charge 1 Mobility  ? ? ?Pre-mobility: 69 HR, 124/71 BP, 99%SpO2 ? ?Pt supine upon arrival using RA. Pt orthostatics taken and completes bed mobility and STS impulsively indep. Pt ambulates 334f voicing no complaints and denying dizziness. RN notified. Pt left with needs in reach and family at bedside. ? ?MMerrily Marquez?Mobility Specialist ?10/07/21, 2:05 PM ? ? ? ?

## 2021-10-07 NOTE — Discharge Summary (Signed)
?Physician Discharge Summary ?  ?Patient: Wayne Marquez MRN: [id number] DOB: [birth year]  ?Admit date:     10/04/2021  ?Discharge date: 10/07/21  ?Discharge Physician: Loletha Grayer  ? ?PCP: Renee Rival, NP  ? ?Recommendations at discharge:  ? ?Follow-up PCP 5 days ?Will need a referral as outpatient to endocrinology ? ?Discharge Diagnoses: ?Principal Problem: ?  Adrenal insufficiency ([place]) ?Active Problems: ?  Orthostatic hypotension ?  Low serum cortisol level ?  Paroxysmal atrial fibrillation ([facility]) ?  BPH (benign prostatic hyperplasia) ?  S/P coronary artery bypass graft x 3 ?  Burning sensation of feet ?  Thyroid nodule ?  Cardiac pacemaker with history of SSS ?  CAD (coronary artery disease), native coronary artery ?  Personal history of malignant neoplasm of bronchus and lung ?  Vitamin B12 deficiency ? ? ? ?Hospital Course: ?The patient was admitted on 10/04/2021 with hypertension and dizziness.  He was found to have orthostatic hypotension.  His antihypertensive medications were held.  He was orthostatic throughout the entire hospital stay.  The patient was given fluids and moved around by the motor ability specialist.  I did an a.m. cortisol which was low at 4.0.  A cosyntropin test showed a base cortisol level of 5.4 and 30 minutes after cosyntropin 14.2 and 60 minutes 17.3 which is a poor response going along with adrenal insufficiency.  I gave a dose of Cortef and will prescribe Cortef upon going home.  ACTH is still pending.  24-hour urine for VMA is also still pending.  The patient was feeling better with ambulation and wanted to go home and follow-up as outpatient. ? ?Assessment and Plan: ?* Adrenal insufficiency ([place]) ?The patient had a poor response to the cosyntropin stimulation test so diagnosis of adrenal insufficiency was made.  I will start Cortef 20 mg in the morning at 8 AM and 10 mg in the p.m. at 3 PM.  The ACTH is still pending which will help determine whether this is primary or  secondary adrenal insufficiency.  Recommend endocrine referral as outpatient. ? ?Orthostatic hypotension ?The patient was orthostatic during the entire hospital course.  Initially his blood pressure dropped into the 80s.  Upon discharge the last set of orthostatics showed the blood pressure dropped down from 170/76 lying down to 124/71 after standing for 3 minutes.  Prior to adding any antihypertensive medications, you must check orthostatics. ? ?Paroxysmal atrial fibrillation ([place]) ?Pharmacy dosing Coumadin.  INR 2.9 today. ? ?BPH (benign prostatic hyperplasia) ?Continue Proscar.  Hold Flomax with orthostatic hypotension today.  May need to restart if has urinary issues (but I would restart it at night if he has to restart it) ? ?Burning sensation of feet ?Likely neuropathy but did not want to start new medications for this.  I did check a B12 level which was low so this could be secondary to vitamin B12 deficiency.  Advised taking over-the-counter vitamin B12 1000 mcg daily.  (This result came in after the patient was discharged, I called the patient's wife to let her know to add the B12 over-the-counter) ? ?S/P coronary artery bypass graft x 3 ?Patient on Crestor. ? ?Thyroid nodule ?Thyroid nodule seen on CT scan.  Will need outpatient ultrasound and endocrine follow-up for this.  TSH and free T4 in the normal range. ? ?Vitamin B12 deficiency ?Vitamin B12 low at 179.  Recommended oral vitamin B12 1000 mcg daily as outpatient.  Can consider B12 injections as outpatient. ? ?Personal history of malignant neoplasm of  bronchus and lung ?Follow-up as outpatient ? ? ? ? ?  ? ? ?Consultants: Neurology ?Procedures performed: None ?Disposition: Home ?Diet recommendation:  ?Cardiac diet ?DISCHARGE MEDICATION: ?Allergies as of 10/07/2021   ? ?   Reactions  ? Lisinopril   ? Other reaction(s): dont remember  ? Sulfa Antibiotics Other (See Comments)  ? GI UPSET  ? ?  ? ?  ?Medication List  ?  ? ?STOP taking these medications    ? ?amLODipine 5 MG tablet ?Commonly known as: NORVASC ?  ?losartan 50 MG tablet ?Commonly known as: COZAAR ?  ?metoprolol tartrate 50 MG tablet ?Commonly known as: LOPRESSOR ?  ?tamsulosin 0.4 MG Caps capsule ?Commonly known as: FLOMAX ?  ? ?  ? ?TAKE these medications   ? ?acetaminophen 325 MG tablet ?Commonly known as: TYLENOL ?Take 325 mg by mouth every 4 (four) hours as needed. ?  ?amitriptyline 50 MG tablet ?Commonly known as: ELAVIL ?Take 1 tablet (50 mg total) by mouth at bedtime. ?  ?finasteride 5 MG tablet ?Commonly known as: PROSCAR ?Take 1 tablet (5 mg total) by mouth daily. ?  ?hydrocortisone 10 MG tablet ?Commonly known as: Cortef ?Take 2 tablets at 8am and 1 tablet at 3pm ?  ?omeprazole 20 MG capsule ?Commonly known as: PRILOSEC ?Take 20 mg by mouth at bedtime. ?  ?rosuvastatin 20 MG tablet ?Commonly known as: CRESTOR ?Take 20 mg by mouth daily. ?  ?sotalol 80 MG tablet ?Commonly known as: BETAPACE ?Take 80 mg by mouth 2 (two) times daily. ?  ?warfarin 5 MG tablet ?Commonly known as: COUMADIN ?Take 5 mg by mouth daily at 4 PM. ?  ? ?  ? ? Follow-up Information   ? ? Renee Rival, NP Follow up in 5 day(s).   ?Specialty: Nurse Practitioner ?Contact information: ?[address] ?[place] [address] ?[phone] ? ? ?  ?  ? ?  ?  ? ?  ? ?Discharge Exam: ?Danley Danker Weights  ? 10/04/21 1510  ?Weight: 79.4 kg  ? ?Physical Exam ?HENT:  ?   Head: Normocephalic.  ?   Mouth/Throat:  ?   Pharynx: No oropharyngeal exudate.  ?Eyes:  ?   General: Lids are normal.  ?   Conjunctiva/sclera: Conjunctivae normal.  ?Cardiovascular:  ?   Rate and Rhythm: Normal rate and regular rhythm.  ?   Heart sounds: Normal heart sounds, S1 normal and S2 normal.  ?Pulmonary:  ?   Breath sounds: Normal breath sounds. No decreased breath sounds, wheezing, rhonchi or rales.  ?Abdominal:  ?   Palpations: Abdomen is soft.  ?   Tenderness: There is no abdominal tenderness.  ?Musculoskeletal:  ?   Right lower leg: No swelling.  ?   Left  lower leg: No swelling.  ?Skin: ?   General: Skin is warm.  ?   Findings: No rash.  ?Neurological:  ?   Mental Status: He is alert and oriented to person, place, and time.  ?  ? ?Condition at discharge: stable ? ?The results of significant diagnostics from this hospitalization (including imaging, microbiology, ancillary and laboratory) are listed below for reference.  ? ?Imaging Studies: ?CT ANGIO HEAD NECK W WO CM ? ?Result Date: 10/04/2021 ?CLINICAL DATA:  Hypertension, shortness of breath EXAM: CT ANGIOGRAPHY HEAD AND NECK TECHNIQUE: Multidetector CT imaging of the head and neck was performed using the standard protocol during bolus administration of intravenous contrast. Multiplanar CT image reconstructions and MIPs were obtained to evaluate the vascular anatomy. Carotid stenosis  measurements (when applicable) are obtained utilizing NASCET criteria, using the distal internal carotid diameter as the denominator. RADIATION DOSE REDUCTION: This exam was performed according to the departmental dose-optimization program which includes automated exposure control, adjustment of the mA and/or kV according to patient size and/or use of iterative reconstruction technique. CONTRAST:  78m OMNIPAQUE IOHEXOL 350 MG/ML SOLN COMPARISON:  No prior CTA, correlation is made with same day CT head FINDINGS: CT HEAD FINDINGS For noncontrast findings, please see same day CT head. 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 Aortic atherosclerosis which extends into the branch vessels. No evidence of aneurysm or dissection. Right carotid system: No evidence of dissection, stenosis (50% or greater) or occlusion. Calcified and noncalcified plaque at the bifurcation and in the proximal ICA are not hemodynamically significant. Left carotid system: Approximate 50% stenosis in the proximal left ICA, secondary to calcified and noncalcified plaque. No  evidence of dissection or occlusion. Vertebral arteries: The extracranial left vertebral artery is only minimally visualized throughout its entire course, with the exception of the left V3 segment. Likely reflect

## 2021-10-07 NOTE — Assessment & Plan Note (Signed)
Vitamin B12 low at 179.  Recommended oral vitamin B12 1000 mcg daily as outpatient.  Can consider B12 injections as outpatient. ?

## 2021-10-07 NOTE — Care Management Important Message (Signed)
Important Message ? ?Patient Details  ?Name: Wayne Marquez ?MRN: [id number] ?Date of Birth: 03/27/1941 ? ? ?Medicare Important Message Given:  N/A - LOS <3 / Initial given by admissions ? ? ? ? ?Dannette Barbara ?10/07/2021, 8:41 AM ?

## 2021-10-08 LAB — ACTH: C206 ACTH: 19.7 pg/mL (ref 7.2–63.3)

## 2021-10-13 DIAGNOSIS — D229 Melanocytic nevi, unspecified: Secondary | ICD-10-CM | POA: Insufficient documentation

## 2021-10-13 DIAGNOSIS — R7301 Impaired fasting glucose: Secondary | ICD-10-CM | POA: Insufficient documentation

## 2021-10-13 DIAGNOSIS — N400 Enlarged prostate without lower urinary tract symptoms: Secondary | ICD-10-CM | POA: Insufficient documentation

## 2021-10-13 DIAGNOSIS — Z6824 Body mass index (BMI) 24.0-24.9, adult: Secondary | ICD-10-CM | POA: Insufficient documentation

## 2021-10-14 LAB — METANEPHRINES, URINE, 24 HOUR
Metaneph Total, Ur: 157 ug/L
Metanephrines, 24H Ur: 283 ug/24 hr — ABNORMAL HIGH (ref 58–276)
Normetanephrine, 24H Ur: 540 ug/24 hr (ref 156–729)
Normetanephrine, Ur: 300 ug/L
Total Volume: 1800

## 2021-10-17 ENCOUNTER — Telehealth: Payer: Self-pay | Admitting: "Endocrinology

## 2021-10-17 NOTE — Telephone Encounter (Signed)
Received referral on pt for Adrenal Insufficieny-called pt, he declined referral. He said he is not coming to [facility]. I called and let Freda Munro know at [facility]. She asked me to hang on to this because this patient is being difficult for them. He asked for it to be sent here originally.  ?

## 2021-10-18 ENCOUNTER — Other Ambulatory Visit: Payer: Self-pay | Admitting: Urology

## 2021-10-18 DIAGNOSIS — N138 Other obstructive and reflux uropathy: Secondary | ICD-10-CM

## 2021-10-24 NOTE — Progress Notes (Signed)
Metanephrines slightly higher than normal range at 283 ?Please follow-up with your medical doctor and/or endocrinologist to determine on whether a CT adrenal gland is indicated or not.  I would think that the metanephrines would be a lot higher if he did have a pheochromocytoma. ? ?Dr. Loletha Grayer. ?

## 2021-10-26 ENCOUNTER — Encounter: Payer: Self-pay | Admitting: "Endocrinology

## 2021-10-26 ENCOUNTER — Ambulatory Visit (INDEPENDENT_AMBULATORY_CARE_PROVIDER_SITE_OTHER): Payer: Medicare Other | Admitting: "Endocrinology

## 2021-10-26 VITALS — BP 178/88 | HR 64 | Ht 67.0 in | Wt 161.0 lb

## 2021-10-26 DIAGNOSIS — E041 Nontoxic single thyroid nodule: Secondary | ICD-10-CM

## 2021-10-26 DIAGNOSIS — E2740 Unspecified adrenocortical insufficiency: Secondary | ICD-10-CM

## 2021-10-26 MED ORDER — HYDROCORTISONE 10 MG PO TABS: ORAL_TABLET | ORAL | 0 refills | Status: DC

## 2021-10-26 NOTE — Progress Notes (Signed)
? ?    Endocrinology Consult Note ?                                           10/26/2021, 6:18 PM ? ? ?Subjective:  ? ? Patient ID: Wayne Marquez, male    DOB: 01/18/1941, PCP Wayne Rival, NP ? ? ?Past Medical History:  ?Diagnosis Date  ? Angina of effort ([place]) 01/20/2014  ? Angina pectoris ([place]) 01/20/2014  ? Anticoagulation goal of INR 2 to 3 04/30/2017  ? Overview:  coumadin; CHADSvasc=3 age, htn, cad  ? 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 Atrial fibrillation ([place]) 01/20/2014  ? 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 CAD (coronary artery disease), native coronary artery 01/20/2014  ? 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 Overview:  Medtr[employer] Advisa  ? 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 Elevated prostate specific antigen (PSA) 01/24/2013  ? Gastroesophageal reflux disease 07/17/2014  ? History of biliary T-tube placement 03/04/2014  ? Overview:  Overview:  EF>55%, mild LVH, mod MR, mod TR, RVSP=80mHg TTE 8/26./2015  ? History of lung cancer 04/30/2017  ? Hyperglyceridemia 01/20/2014  ? Hypertension, benign 01/20/2014  ? Kidney stone 07/17/2014  ? 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 Paroxysmal atrial fibrillation ([place] 07/17/2014  ? 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 Personal history of malignant neoplasm of bronchus and lung 01/20/2014  ? Pre-syncope 07/17/2014  ? Presence of cardiac pacemaker 06/15/2014  ? Overview:  Overview:  Medtronic Advisa  ? Pulmonary hypertension ([place] 07/17/2014  ? Overview:  RVSP=489mG by TTE 03/04/2014  Overview:  Overview:  RVSP=4780m by TTE 03/04/2014  ? 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 Seasonal allergic rhinitis 07/17/2014  ? Overview:  Overview:  allergic rhinitis  ? SI (sacroiliac) joint dysfunction 08/10/2014  ? 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/02/2015  ? Overview:  Overview:  sinoatrial node dysfunction with dual chamber  pacemaker in situ; paroxysmal AFib  ? S[place]atrial node dysfunction ([place]/14/2015  ? Syncope and collapse 01/20/2014  ? ?Past Surgical History:  ?Procedure Laterality Date  ? CARDIAC ELECTROPHYSIOLOGY MAPPING AND ABLATION    ? LEFT HEART CATH AND CORONARY ANGIOGRAPHY Left 09/20/2017  ? Procedure: LEFT HEART CATH AND CORONARY ANGIOGRAPHY;  Surgeon: Wayne Marquez;  [facility];  Service: Cardiovascular;  Laterality: Left;  ? LUNG LOBECTOMY    ? PACEMAKER IMPLANT  2014  ? ?Social History  ? ?Socioeconomic History  ? Marital status: Married  ?  Spouse name: Not on file  ? Number of children: Not on file  ? Years of education: Not on file  ? Highest education level: Not on file  ?Occupational History  ? Not on file  ?Tobacco Use  ? Smoking status: Former  ? Smokeless tobacco: Former  ?Vaping Use  ? Vaping Use: Never used  ?Substance and Sexual Activity  ? Alcohol use: No  ? Drug use: No  ? Sexual activity: Not on file  ?Other Topics Concern  ? Not on file  ?Social History Narrative  ? Not on file  ? ?Social Determinants of Health  ? ?Financial Resource Strain: Not on file  ?Food Insecurity: Not on file  ?Transportation Needs: Not on file  ?Physical Activity: Not on file  ?Stress:  Not on file  ?Social Connections: Not on file  ? ?Family History  ?Problem Relation Age of Onset  ? Bladder Cancer Neg Hx   ? Kidney cancer Neg Hx   ? Prostate cancer Neg Hx   ? ?Outpatient Encounter Medications as of 10/26/2021  ?Medication Sig  ? cyanocobalamin 1000 MCG tablet Take 1,000 mcg by mouth daily.  ? losartan (COZAAR) 25 MG tablet Take 25 mg by mouth daily with breakfast.  ? Multiple Vitamin (MULTIVITAMIN ADULT PO) Take 1 tablet by mouth daily.  ? acetaminophen (TYLENOL) 325 MG tablet Take 325 mg by mouth every 4 (four) hours as needed.  ? amitriptyline (ELAVIL) 50 MG tablet Take 1 tablet (50 mg total) by mouth at bedtime.  ? finasteride (PROSCAR) 5 MG tablet Take 1 tablet (5 mg total) by mouth daily.  ?  hydrocortisone (CORTEF) 10 MG tablet Take 1 tablets at 8am and 1 tablet at 3pm  ? omeprazole (PRILOSEC) 20 MG capsule Take 20 mg by mouth at bedtime.   ? rosuvastatin (CRESTOR) 20 MG tablet Take 20 mg by mouth daily.  ? sotalol (BETAPACE) 80 MG tablet Take 80 mg by mouth 2 (two) times daily.  ? warfarin (COUMADIN) 5 MG tablet Take 5 mg by mouth daily at 4 PM.  ? [DISCONTINUED] hydrocortisone (CORTEF) 10 MG tablet Take 2 tablets at 8am and 1 tablet at 3pm  ? ?No facility-administered encounter medications on file as of 10/26/2021.  ? ?ALLERGIES: ?Allergies  ?Allergen Reactions  ? Lisinopril   ?  Other reaction(s): dont remember  ? Sulfa Antibiotics Other (See Comments)  ?  GI UPSET  ? ? ?VACCINATION STATUS: ? ?There is no immunization history on file for this patient. ? ?HPI ?Wayne Marquez is 81 y.o. male who presents today with a medical history as above. he is being seen in consultation for  adrenal insufficiency and thyroid nodule  requested by Wayne Rival, NP. ?He is accompanied by his wife. Hx is taken from him as well as his chart review. ?Late March 2023, he was admitted to [facility] for HTN and dizziness.  ?He was found to be orthostatic both at ER and in the rooms. He had AM cortisol sub-optimal at 4 . Subsequent ACTH stimulation test reveals Baseline cortisol of 5.4, 30 minutes at 14.2 and 60 minutes at 17.3. ?He was discharged on Hydrocortisone 20 mg am and 10 mg PM. He observed that his BP got out of control after discharge. Before hospitalization he took medications for BP including losartan 25 mg po qam. He was told not to take it. His BP today was high at 178/88. ?He denies any prior hx of adrenal injury / trauma, nor exposure to high dose steroids. ?He has no acute complaints today but he would like to know if his adrenal glands will ever work again. ?While undergoing carotid doppler he was noted to have a thyroid nodule. ? ?Review of Systems ? ?Constitutional: + fluctuating body weight, no  fatigue, no subjective hyperthermia, no subjective hypothermia ?Eyes: no blurry vision, no xerophthalmia ?ENT: no sore throat, no nodules palpated in throat, no dysphagia/odynophagia, no hoarseness ?Cardiovascular: no Chest Pain, no Shortness of Breath, no palpitations, no leg swelling ?Respiratory: no cough, no shortness of breath ?Gastrointestinal: no Nausea/Vomiting/Diarhhea ?Musculoskeletal: no muscle/joint aches ?Skin: no rashes ?Neurological: no tremors, no numbness, no tingling, no dizziness ?Psychiatric: no depression, no anxiety ? ?Objective:  ?  ? ?  10/26/2021  ?  1:30 PM 10/07/2021  ?  9:55 AM 10/07/2021  ?  7:33 AM  ?Vitals with BMI  ?Height '5\' 7"'$     ?Weight 161 lbs    ?BMI 25.21    ?Systolic 388 828 003  ?Diastolic 88 76 72  ?Pulse 64  66  ? ? ?BP (!) 178/88   Pulse 64   Ht '5\' 7"'$  (1.702 m)   Wt 161 lb (73 kg)   BMI 25.22 kg/m?   ?Wt Readings from Last 3 Encounters:  ?10/26/21 161 lb (73 kg)  ?10/04/21 175 lb (79.4 kg)  ?07/22/21 175 lb (79.4 kg)  ?  ?Physical Exam ? ?Constitutional:  Body mass index is 25.22 kg/m?.,  not in acute distress, normal state of mind ?Eyes: PERRLA, EOMI, no exophthalmos ?ENT: moist mucous membranes, no gross thyromegaly, no gross cervical lymphadenopathy ?Cardiovascular: normal precordial activity, Regular Rate and Rhythm, no Murmur/Rubs/Gallops ?Respiratory:  adequate breathing efforts, no gross chest deformity, Clear to auscultation bilaterally ?Gastrointestinal: abdomen soft, Non -tender, No distension, Bowel Sounds present, no gross organomegaly ?Musculoskeletal: no gross deformities, strength intact in all four extremities ?Skin: moist, warm, no rashes ?Neurological: no tremor with outstretched hands, Deep tendon reflexes normal in bilateral lower extremities. ? ?CMP ( most recent) ?CMP  ?   ?Component Value Date/Time  ? NA 141 10/06/2021 0352  ? NA 141 04/02/2014 1456  ? K 3.7 10/06/2021 0352  ? K 4.3 04/02/2014 1456  ? CL 110 10/06/2021 0352  ? CL 108 (H) 04/02/2014  1456  ? CO2 25 10/06/2021 0352  ? CO2 26 04/02/2014 1456  ? GLUCOSE 94 10/06/2021 0352  ? GLUCOSE 89 04/02/2014 1456  ? BUN 16 10/06/2021 0352  ? BUN 17 04/02/2014 1456  ? CREATININE 0.88 10/06/2021 0352  ? CREAT

## 2021-11-03 ENCOUNTER — Other Ambulatory Visit: Payer: Self-pay | Admitting: Family Medicine

## 2021-11-03 DIAGNOSIS — N138 Other obstructive and reflux uropathy: Secondary | ICD-10-CM

## 2021-11-03 MED ORDER — AMITRIPTYLINE HCL 50 MG PO TABS: 50.0000 mg | ORAL_TABLET | Freq: Every day | ORAL | 3 refills | Status: DC

## 2021-11-04 ENCOUNTER — Other Ambulatory Visit: Payer: PRIVATE HEALTH INSURANCE

## 2021-11-17 ENCOUNTER — Ambulatory Visit: Payer: Medicare Other | Admitting: "Endocrinology

## 2021-11-21 ENCOUNTER — Ambulatory Visit
Admission: RE | Admit: 2021-11-21 | Discharge: 2021-11-21 | Disposition: A | Discharge status: Home / Self Care | Payer: Medicare Other | Source: Ambulatory Visit | Attending: "Endocrinology | Admitting: "Endocrinology

## 2021-11-21 ENCOUNTER — Ambulatory Visit: Payer: PRIVATE HEALTH INSURANCE

## 2021-11-21 DIAGNOSIS — E041 Nontoxic single thyroid nodule: Secondary | ICD-10-CM | POA: Insufficient documentation

## 2021-11-22 ENCOUNTER — Ambulatory Visit (INDEPENDENT_AMBULATORY_CARE_PROVIDER_SITE_OTHER): Payer: Medicare Other | Admitting: "Endocrinology

## 2021-11-22 ENCOUNTER — Encounter: Payer: Self-pay | Admitting: "Endocrinology

## 2021-11-22 VITALS — BP 116/78 | HR 80 | Ht 67.0 in | Wt 162.4 lb

## 2021-11-22 DIAGNOSIS — E2740 Unspecified adrenocortical insufficiency: Secondary | ICD-10-CM | POA: Diagnosis not present

## 2021-11-22 DIAGNOSIS — I10 Essential (primary) hypertension: Secondary | ICD-10-CM | POA: Diagnosis not present

## 2021-11-22 DIAGNOSIS — E042 Nontoxic multinodular goiter: Secondary | ICD-10-CM | POA: Diagnosis not present

## 2021-11-22 NOTE — Progress Notes (Signed)
? ?     ?                                           11/22/2021, 5:38 PM ? ?Endocrinology follow-up note ? ? ?Subjective:  ? ? Patient ID: Wayne Marquez, male    DOB: 03-Sep-1940, PCP Renee Rival, NP ? ? ?Past Medical History:  ?Diagnosis Date  ? Angina of effort ([place]) 01/20/2014  ? Angina pectoris ([place]) 01/20/2014  ? Anticoagulation goal of INR 2 to 3 04/30/2017  ? Overview:  coumadin; CHADSvasc=3 age, htn, cad  ? 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 Atrial fibrillation ([place]) 01/20/2014  ? 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 CAD (coronary artery disease), native coronary artery 01/20/2014  ? 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 Overview:  Medtronic Advisa  ? 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 Elevated prostate specific antigen (PSA) 01/24/2013  ? Gastroesophageal reflux disease 07/17/2014  ? History of biliary T-tube placement 03/04/2014  ? Overview:  Overview:  EF>55%, mild LVH, mod MR, mod TR, RVSP=34mHg TTE 8/26./2015  ? History of lung cancer 04/30/2017  ? Hyperglyceridemia 01/20/2014  ? Hypertension, benign 01/20/2014  ? Kidney stone 07/17/2014  ? 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 Paroxysmal atrial fibrillation [place] 07/17/2014  ? 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 Personal history of malignant neoplasm of bronchus and lung 01/20/2014  ? Pre-syncope 07/17/2014  ? Presence of cardiac pacemaker 06/15/2014  ? Overview:  Overview:  [employer]tronic Advisa  ? Pulmonary hypertension ([place] 07/17/2014  ? Overview:  RVSP=466mG by TTE 03/04/2014  Overview:  Overview:  RVSP=4772m by TTE 03/04/2014  ? 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 Seasonal allergic rhinitis 07/17/2014  ? Overview:  Overview:  allergic rhinitis  ? SI (sacroiliac) joint dysfunction 08/10/2014  ? Sick sinus syndrome ([place]/02/2015  ? Overview:  Overview:  sinoatrial node dysfunction with dual  chamber pacemaker in situ; paroxysmal AFib  ? S[place]atrial node dysfunction ([place]/14/2015  ? Syncope and collapse 01/20/2014  ? ?Past Surgical History:  ?Procedure Laterality Date  ? CARDIAC ELECTROPHYSIOLOGY MAPPING AND ABLATION    ? LEFT HEART CATH AND CORONARY ANGIOGRAPHY Left 09/20/2017  ? Procedure: LEFT HEART CATH AND CORONARY ANGIOGRAPHY;  Surgeon: CalYolonda KidaD;  [facility];  Service: Cardiovascular;  Laterality: Left;  ? LUNG LOBECTOMY    ? PACEMAKER IMPLANT  2014  ? ?Social History  ? ?Socioeconomic History  ? Marital status: Married  ?  Spouse name: Not on file  ? Number of children: Not on file  ? Years of education: Not on file  ? Highest education level: Not on file  ?Occupational History  ? Not on file  ?Tobacco Use  ? Smoking status: Former  ? Smokeless tobacco: Former  ?Vaping Use  ? Vaping Use: Never used  ?Substance and Sexual Activity  ? Alcohol use: No  ? Drug use: No  ? Sexual activity: Not on file  ?Other Topics Concern  ? Not on file  ?Social History Narrative  ? Not on file  ? ?Social Determinants of Health  ? ?Financial Resource Strain: Not on file  ?Food Insecurity: Not on file  ?Transportation Needs: Not on file  ?Physical Activity: Not on file  ?  Stress: Not on file  ?Social Connections: Not on file  ? ?Family History  ?Problem Relation Age of Onset  ? Bladder Cancer Neg Hx   ? Kidney cancer Neg Hx   ? Prostate cancer Neg Hx   ? ?Outpatient Encounter Medications as of 11/22/2021  ?Medication Sig  ? acetaminophen (TYLENOL) 325 MG tablet Take 325 mg by mouth every 4 (four) hours as needed.  ? amitriptyline (ELAVIL) 50 MG tablet Take 1 tablet (50 mg total) by mouth at bedtime.  ? cyanocobalamin 1000 MCG tablet Take 1,000 mcg by mouth daily.  ? finasteride (PROSCAR) 5 MG tablet Take 1 tablet (5 mg total) by mouth daily.  ? hydrocortisone (CORTEF) 10 MG tablet Take 1 tablets at 8am and 1 tablet at 3pm (Patient not taking: Reported on 11/22/2021)  ? losartan (COZAAR)  25 MG tablet Take 25 mg by mouth daily with breakfast.  ? Multiple Vitamin (MULTIVITAMIN ADULT PO) Take 1 tablet by mouth daily.  ? omeprazole (PRILOSEC) 20 MG capsule Take 20 mg by mouth at bedtime.   ? rosuvastatin (CRESTOR) 20 MG tablet Take 20 mg by mouth daily.  ? sotalol (BETAPACE) 80 MG tablet Take 80 mg by mouth 2 (two) times daily.  ? warfarin (COUMADIN) 5 MG tablet Take 5 mg by mouth daily at 4 PM.  ? ?No facility-administered encounter medications on file as of 11/22/2021.  ? ?ALLERGIES: ?Allergies  ?Allergen Reactions  ? Lisinopril   ?  Other reaction(s): dont remember  ? Sulfa Antibiotics Other (See Comments)  ?  GI UPSET  ? ? ?VACCINATION STATUS: ? ?There is no immunization history on file for this patient. ? ?HPI ?Wayne Marquez is 81 y.o. male who presents today to follow-up after he was seen in consultation last visit for adrenal insufficiency.  He is accompanied by his wife to clinic today.   ? ?See notes from previous visit.   ?Hx is taken from him as well as his chart review. ?Late March 2023, he was admitted to [facility] for HTN and dizziness.  ?He was found to be orthostatic both at ER and in the rooms. He had AM cortisol sub-optimal at 4 . Subsequent ACTH stimulation test reveals Baseline cortisol of 5.4, 30 minutes at 14.2 and 60 minutes at 17.3. ?He was discharged on Hydrocortisone 20 mg am and 10 mg PM.  During his last visit, he was advised to lower his hydrocortisone to 10 p.o. twice daily.  However, patient discontinued his hydrocortisone altogether.  No subsequent symptoms develop.   ? ?He was also advised to take losartan 25 mg p.o. daily to control blood pressure.  Patient states that he has been taking on and off.  He presents with stable weight, control blood pressure.   ? ?He denies any prior hx of adrenal injury / trauma, nor exposure to high dose steroids. ?He has no acute complaints today but he would like to avoid medications if he can.   ? ? ?While undergoing carotid doppler he  was noted to have a thyroid nodule.  He was sent for dedicated thyroid ultrasound which revealed benign looking 1.8 cm nodule on the right lobe of the thyroid. ? ?Review of Systems ? ?Constitutional: + fluctuating body weight, no fatigue, no subjective hyperthermia, no subjective hypothermia ?Eyes: no blurry vision, no xerophthalmia ?ENT: no sore throat, no nodules palpated in throat, no dysphagia/odynophagia, no hoarseness ?Cardiovascular: no Chest Pain, no Shortness of Breath, no palpitations, no leg swelling ?Respiratory: no cough, no shortness  of breath ?Gastrointestinal: no Nausea/Vomiting/Diarhhea ?Musculoskeletal: no muscle/joint aches ?Skin: no rashes ?Neurological: no tremors, no numbness, no tingling, no dizziness ?Psychiatric: no depression, no anxiety ? ?Objective:  ?  ? ?  11/22/2021  ? 10:34 AM 10/26/2021  ?  1:30 PM 10/07/2021  ?  9:55 AM  ?Vitals with BMI  ?Height '5\' 7"'$  '5\' 7"'$    ?Weight 162 lbs 6 oz 161 lbs   ?BMI 25.43 25.21   ?Systolic 448 185 631  ?Diastolic 78 88 76  ?Pulse 80 64   ? ? ?BP 116/78   Pulse 80   Ht '5\' 7"'$  (1.702 m)   Wt 162 lb 6.4 oz (73.7 kg)   BMI 25.44 kg/m?   ?Wt Readings from Last 3 Encounters:  ?11/22/21 162 lb 6.4 oz (73.7 kg)  ?10/26/21 161 lb (73 kg)  ?10/04/21 175 lb (79.4 kg)  ?  ?Physical Exam ? ?Constitutional:  Body mass index is 25.44 kg/m?.,  not in acute distress, normal state of mind ?Eyes: PERRLA, EOMI, no exophthalmos ?ENT: moist mucous membranes, no gross thyromegaly, no gross cervical lymphadenopathy ?Cardiovascular: normal precordial activity, Regular Rate and Rhythm, no Murmur/Rubs/Gallops ?Respiratory:  adequate breathing efforts, no gross chest deformity, Clear to auscultation bilaterally ?Gastrointestinal: abdomen soft, Non -tender, No distension, Bowel Sounds present, no gross organomegaly ?Musculoskeletal: no gross deformities, strength intact in all four extremities ?Skin: moist, warm, no rashes ?Neurological: no tremor with outstretched hands, Deep  tendon reflexes normal in bilateral lower extremities. ? ?CMP ( most recent) ?CMP  ?   ?Component Value Date/Time  ? NA 141 10/06/2021 0352  ? NA 141 04/02/2014 1456  ? K 3.7 10/06/2021 0352  ? K 4.3 04/02/2014

## 2022-01-04 ENCOUNTER — Other Ambulatory Visit (HOSPITAL_COMMUNITY): Payer: Self-pay | Admitting: Orthopedic Surgery

## 2022-01-04 ENCOUNTER — Other Ambulatory Visit: Payer: Self-pay | Admitting: Orthopedic Surgery

## 2022-01-04 DIAGNOSIS — M542 Cervicalgia: Secondary | ICD-10-CM

## 2022-01-04 DIAGNOSIS — R258 Other abnormal involuntary movements: Secondary | ICD-10-CM

## 2022-03-06 ENCOUNTER — Ambulatory Visit (HOSPITAL_COMMUNITY)
Admission: RE | Admit: 2022-03-06 | Discharge: 2022-03-06 | Disposition: A | Discharge status: Home / Self Care | Payer: Medicare Other | Source: Ambulatory Visit | Attending: Orthopedic Surgery | Admitting: Orthopedic Surgery

## 2022-03-06 DIAGNOSIS — M542 Cervicalgia: Secondary | ICD-10-CM | POA: Insufficient documentation

## 2022-03-06 DIAGNOSIS — R258 Other abnormal involuntary movements: Secondary | ICD-10-CM | POA: Diagnosis present

## 2022-03-06 NOTE — Progress Notes (Signed)
Programmed to DOO at 75 bpm  MRI mode  Will Program device back to pre-MRI settings after completion of exam, and send transmission.

## 2022-06-29 ENCOUNTER — Other Ambulatory Visit: Payer: Self-pay | Admitting: Urology

## 2022-06-29 DIAGNOSIS — N138 Other obstructive and reflux uropathy: Secondary | ICD-10-CM

## 2022-06-30 ENCOUNTER — Emergency Department
Admission: EM | Admit: 2022-06-30 | Discharge: 2022-06-30 | Disposition: A | Discharge status: Home / Self Care | Payer: Medicare Other | Attending: Emergency Medicine | Admitting: Emergency Medicine

## 2022-06-30 ENCOUNTER — Emergency Department: Payer: Medicare Other

## 2022-06-30 ENCOUNTER — Other Ambulatory Visit: Payer: Self-pay

## 2022-06-30 DIAGNOSIS — Z20822 Contact with and (suspected) exposure to covid-19: Secondary | ICD-10-CM | POA: Insufficient documentation

## 2022-06-30 DIAGNOSIS — I110 Hypertensive heart disease with heart failure: Secondary | ICD-10-CM | POA: Diagnosis not present

## 2022-06-30 DIAGNOSIS — Z950 Presence of cardiac pacemaker: Secondary | ICD-10-CM | POA: Insufficient documentation

## 2022-06-30 DIAGNOSIS — D72829 Elevated white blood cell count, unspecified: Secondary | ICD-10-CM | POA: Insufficient documentation

## 2022-06-30 DIAGNOSIS — Z85118 Personal history of other malignant neoplasm of bronchus and lung: Secondary | ICD-10-CM | POA: Insufficient documentation

## 2022-06-30 DIAGNOSIS — R0602 Shortness of breath: Secondary | ICD-10-CM | POA: Diagnosis present

## 2022-06-30 DIAGNOSIS — I509 Heart failure, unspecified: Secondary | ICD-10-CM | POA: Insufficient documentation

## 2022-06-30 DIAGNOSIS — I2510 Atherosclerotic heart disease of native coronary artery without angina pectoris: Secondary | ICD-10-CM | POA: Insufficient documentation

## 2022-06-30 DIAGNOSIS — R791 Abnormal coagulation profile: Secondary | ICD-10-CM | POA: Diagnosis not present

## 2022-06-30 DIAGNOSIS — Z7901 Long term (current) use of anticoagulants: Secondary | ICD-10-CM | POA: Insufficient documentation

## 2022-06-30 DIAGNOSIS — R0600 Dyspnea, unspecified: Secondary | ICD-10-CM

## 2022-06-30 LAB — COMPREHENSIVE METABOLIC PANEL
ALT: 39 U/L (ref 0–44)
AST: 35 U/L (ref 15–41)
Albumin: 3.3 g/dL — ABNORMAL LOW (ref 3.5–5.0)
Alkaline Phosphatase: 74 U/L (ref 38–126)
Anion gap: 8 (ref 5–15)
BUN: 23 mg/dL (ref 8–23)
CO2: 25 mmol/L (ref 22–32)
Calcium: 8.3 mg/dL — ABNORMAL LOW (ref 8.9–10.3)
Chloride: 103 mmol/L (ref 98–111)
Creatinine, Ser: 1.19 mg/dL (ref 0.61–1.24)
GFR, Estimated: >60 mL/min (ref >60)
Glucose, Bld: 111 mg/dL — ABNORMAL HIGH (ref 70–99)
Potassium: 4.2 mmol/L (ref 3.5–5.1)
Sodium: 136 mmol/L (ref 135–145)
Total Bilirubin: 1.3 mg/dL — ABNORMAL HIGH (ref 0.3–1.2)
Total Protein: 6.7 g/dL (ref 6.5–8.1)

## 2022-06-30 LAB — CBC
HCT: 45.9 % (ref 39.0–52.0)
Hemoglobin: 15.5 g/dL (ref 13.0–17.0)
MCH: 30.8 pg (ref 26.0–34.0)
MCHC: 33.8 g/dL (ref 30.0–36.0)
MCV: 91.1 fL (ref 80.0–100.0)
Platelets: 197 10*3/uL (ref 150–400)
RBC: 5.04 MIL/uL (ref 4.22–5.81)
RDW: 13.1 % (ref 11.5–15.5)
WBC: 15.6 10*3/uL — ABNORMAL HIGH (ref 4.0–10.5)
nRBC: 0 % (ref 0.0–0.2)

## 2022-06-30 LAB — D-DIMER, QUANTITATIVE: D-Dimer, Quant: 0.43 ug/mL-FEU (ref 0.00–0.50)

## 2022-06-30 LAB — RESP PANEL BY RT-PCR (RSV, FLU A&B, COVID)  RVPGX2
Influenza A by PCR: NEGATIVE
Influenza B by PCR: NEGATIVE
Resp Syncytial Virus by PCR: NEGATIVE
SARS Coronavirus 2 by RT PCR: NEGATIVE

## 2022-06-30 LAB — PROTIME-INR
INR: 1.5 — ABNORMAL HIGH (ref 0.8–1.2)
Prothrombin Time: 18.2 seconds — ABNORMAL HIGH (ref 11.4–15.2)

## 2022-06-30 LAB — TROPONIN I (HIGH SENSITIVITY): Troponin I (High Sensitivity): 6 ng/L (ref <18)

## 2022-06-30 MED ORDER — IPRATROPIUM-ALBUTEROL 0.5-2.5 (3) MG/3ML IN SOLN
3.0000 mL | Freq: Once | RESPIRATORY_TRACT | Status: AC
  Administered 2022-06-30: 3 mL via RESPIRATORY_TRACT
  Filled 2022-06-30: qty 3

## 2022-06-30 MED ORDER — ALBUTEROL SULFATE HFA 108 (90 BASE) MCG/ACT IN AERS: 2.0000 | INHALATION_SPRAY | Freq: Four times a day (QID) | RESPIRATORY_TRACT | 0 refills | Status: DC | PRN

## 2022-06-30 NOTE — ED Provider Triage Note (Signed)
Emergency Medicine Provider Triage Evaluation Note  Wayne Marquez, a 81 y.o. male  was evaluated in triage.  Pt complains of shortness of breath x 2 weeks.  Patient with a history of lung CA with remote lobectomy, presents due to ongoing symptoms despite being treated with antibiotic by his PCP.  He denies any frank fevers but does note chills.  Review of Systems  Positive: SOB, chills Negative: Fevers  Physical Exam  There were no vitals taken for this visit. Gen:   Awake, no distress  NAD Resp:  Normal effort CTA MSK:   Moves extremities without difficulty  Other:    Medical Decision Making  Medically screening exam initiated at 11:41 AM.  Appropriate orders placed.  EUGINE BUBB was informed that the remainder of the evaluation will be completed by another provider, this initial triage assessment does not replace that evaluation, and the importance of remaining in the ED until their evaluation is complete.  Geriatric patient to the ED for evaluation of 2 weeks of shortness of breath.   Melvenia Needles, PA-C 06/30/22 1142

## 2022-06-30 NOTE — ED Provider Notes (Signed)
[facility] Provider Note    Event Date/Time   First MD Initiated Contact with Patient 06/30/22 1317     (approximate)   History   Chief Complaint Shortness of Breath   HPI  Wayne Marquez is a 81 y.o. male with past medical history of hypertension, CAD, atrial fibrillation on Coumadin, CHF, sick sinus syndrome status post pacemaker, GERD, and lung cancer status post lobectomy who presents to the ED complaining of shortness of breath.  Patient reports that he has been having persistent feelings of difficulty breathing over the past 2 weeks.  Symptoms are constant whether he exerts himself or not, denies any associated fever or cough.  He has not had any pain in his chest, but states it feels like his "throat is closing."  He states this sensation has not changed over the past 2 weeks and he has not had any rash, vomiting, or diarrhea.  He denies any history of similar symptoms, does not smoke and denies any history of COPD.     Physical Exam   Triage Vital Signs: ED Triage Vitals  Enc Vitals Group     BP 06/30/22 1142 122/75     Pulse Rate 06/30/22 1142 64     Resp 06/30/22 1142 (!) 24     Temp 06/30/22 1142 98.2 F (36.8 C)     Temp Source 06/30/22 1142 Oral     SpO2 06/30/22 1142 97 %     Weight 06/30/22 1143 162 lb (73.5 kg)     Height 06/30/22 1143 '5\' 7"'$  (1.702 m)     Head Circumference --      Peak Flow --      Pain Score 06/30/22 1143 8     Pain Loc --      Pain Edu? --      Excl. in [place]? --     Most recent vital signs: Vitals:   06/30/22 1142 06/30/22 1321  BP: 122/75 120/70  Pulse: 64 68  Resp: (!) 24 (!) 22  Temp: 98.2 F (36.8 C)   SpO2: 97% 98%    Constitutional: Alert and oriented. Eyes: Conjunctivae are normal. Head: Atraumatic. Nose: No congestion/rhinnorhea. Mouth/Throat: Mucous membranes are moist.  Cardiovascular: Normal rate, regular rhythm. Grossly normal heart sounds.  2+ radial pulses  bilaterally. Respiratory: Normal respiratory effort.  No retractions. Lungs CTAB. Gastrointestinal: Soft and nontender. No distention. Musculoskeletal: No lower extremity tenderness nor edema.  Neurologic:  Normal speech and language. No gross focal neurologic deficits are appreciated.    ED Results / Procedures / Treatments   Labs (all labs ordered are listed, but only abnormal results are displayed) Labs Reviewed  COMPREHENSIVE METABOLIC PANEL - Abnormal; Notable for the following components:      Result Value   Glucose, Bld 111 (*)    Calcium 8.3 (*)    Albumin 3.3 (*)    Total Bilirubin 1.3 (*)    All other components within normal limits  CBC - Abnormal; Notable for the following components:   WBC 15.6 (*)    All other components within normal limits  PROTIME-INR - Abnormal; Notable for the following components:   Prothrombin Time 18.2 (*)    INR 1.5 (*)    All other components within normal limits  RESP PANEL BY RT-PCR (RSV, FLU A&B, COVID)  [id number]  D-DIMER, QUANTITATIVE  TROPONIN I (HIGH SENSITIVITY)     EKG  ED ECG REPORT I, Blake Divine, the attending physician, personally viewed  and interpreted this ECG.   Date: 06/30/2022  EKG Time: 11:39  Rate: 77  Rhythm: Atrial paced rhythm  Axis: Normal  Intervals:none  ST&T Change: None  RADIOLOGY Chest x-ray reviewed and interpreted by me with no infiltrate, edema, or effusion.  PROCEDURES:  Critical Care performed: No  Procedures   MEDICATIONS ORDERED IN ED: Medications  ipratropium-albuterol (DUONEB) 0.5-2.5 (3) MG/3ML nebulizer solution 3 mL (3 mLs Nebulization Given 06/30/22 1401)     IMPRESSION / MDM / ASSESSMENT AND PLAN / ED COURSE  I reviewed the triage vital signs and the nursing notes.                              81 y.o. male with past medical history of hypertension, CAD, CHF, atrial fibrillation on Coumadin, sick sinus syndrome status post pacemaker, and lung cancer status post  lobectomy who presents to the ED complaining of persistent difficulty breathing over the past 2 weeks.  Patient's presentation is most consistent with acute presentation with potential threat to life or bodily function.  Differential diagnosis includes, but is not limited to, ACS, PE, pneumonia, CHF, COPD, bronchitis, influenza, COVID-19.  Patient well-appearing and in no acute distress, vital signs remarkable for mild tachypnea in triage but otherwise reassuring.  Patient is not in any respiratory distress on my assessment, maintaining oxygen saturations at 98% on room air with lungs clear to auscultation bilaterally.  EKG shows atrial paced rhythm with no ischemic changes, chest x-ray is negative for acute process but does show evidence of emphysema.  Given this finding, we will trial DuoNeb.  Labs thus far are remarkable for mild leukocytosis but show no anemia, electrolyte abnormality, or AKI.  Infection seems less likely given constant symptoms for 2 weeks with no fever or cough.  We will check D-dimer and INR, reassess following DuoNeb.  D-dimer within normal limits and I doubt PE as patient is low risk by [place].  He is subtherapeutic with INR of 1.5, was informed of this finding but do not feel this makes him higher risk for PE given he takes Coumadin for atrial fibrillation rather than history of DVT/PE.  Patient with slight improvement in symptoms following DuoNeb and we will prescribe albuterol inhaler for use at home.  He does report some hoarse voice and may have a component of laryngitis contributing to his symptoms, but no stridor noted.  He is appropriate for discharge home with PCP follow-up, was counseled to have his INR rechecked and to return to the ED for new or worsening symptoms.  Patient agrees with plan.      FINAL CLINICAL IMPRESSION(S) / ED DIAGNOSES   Final diagnoses:  Dyspnea, unspecified type     Rx / DC Orders   ED Discharge Orders          Ordered    albuterol  (VENTOLIN HFA) 108 (90 Base) MCG/ACT inhaler  Every 6 hours PRN       Note to Pharmacy: Please supply with spacer   06/30/22 1447             Note:  This document was prepared using Dragon voice recognition software and may include unintentional dictation errors.   Blake Divine, MD 06/30/22 [phone]

## 2022-06-30 NOTE — ED Notes (Signed)
See triage note   Presents with some SOB  States he developed cold sx's about 2 weeks ago  Was placed on meds  but states he is not any better Unsure of fever and is currently afebrile   States he feels like he can't get a good breath

## 2022-06-30 NOTE — ED Triage Notes (Signed)
Patient has been having cough and sob for 2 weeks.  Was given antibiotics but not getting better.  Patient is hoarse and states "it feels like my windpipe isnt open enough."  Hx of lung cancer several years ago with right upper lobe removal.  Reports chills but no fever.

## 2022-07-13 ENCOUNTER — Other Ambulatory Visit: Payer: Self-pay | Admitting: Otolaryngology

## 2022-07-13 DIAGNOSIS — R1310 Dysphagia, unspecified: Secondary | ICD-10-CM

## 2022-07-19 ENCOUNTER — Other Ambulatory Visit: Payer: Self-pay | Admitting: Otolaryngology

## 2022-07-19 ENCOUNTER — Ambulatory Visit
Admission: RE | Admit: 2022-07-19 | Discharge: 2022-07-19 | Disposition: A | Discharge status: Home / Self Care | Payer: Medicare Other | Source: Ambulatory Visit | Attending: Otolaryngology | Admitting: Otolaryngology

## 2022-07-19 DIAGNOSIS — R1310 Dysphagia, unspecified: Secondary | ICD-10-CM

## 2022-07-24 ENCOUNTER — Ambulatory Visit: Payer: PRIVATE HEALTH INSURANCE

## 2022-07-27 ENCOUNTER — Ambulatory Visit: Payer: Medicare Other | Admitting: Urology

## 2022-07-28 ENCOUNTER — Other Ambulatory Visit: Payer: Self-pay

## 2022-08-02 ENCOUNTER — Ambulatory Visit (INDEPENDENT_AMBULATORY_CARE_PROVIDER_SITE_OTHER): Payer: Medicare Other | Admitting: Gastroenterology

## 2022-08-02 ENCOUNTER — Encounter: Payer: Self-pay | Admitting: Gastroenterology

## 2022-08-02 ENCOUNTER — Other Ambulatory Visit: Payer: Self-pay

## 2022-08-02 VITALS — BP 172/95 | HR 80 | Temp 97.8°F | Ht 67.0 in | Wt 153.5 lb

## 2022-08-02 DIAGNOSIS — R933 Abnormal findings on diagnostic imaging of other parts of digestive tract: Secondary | ICD-10-CM

## 2022-08-02 DIAGNOSIS — K219 Gastro-esophageal reflux disease without esophagitis: Secondary | ICD-10-CM

## 2022-08-02 NOTE — Progress Notes (Unsigned)
Wayne Darby, MD [address]  [place], [place] [address]  Main: [phone]  Fax: [phone]    Gastroenterology Consultation  Referring Provider:     Margaretha Sheffield, MD Primary Care Physician:  Renee Rival, NP Primary Gastroenterologist:  Dr. Cephas Marquez Reason for Consultation: Abnormal barium esophagogram        HPI:   Wayne Marquez is a 82 y.o. male referred by Dr. Renee Rival, NP  for consultation & management of abnormal barium esophagogram.  Patient was evaluated by Dr. Lamar Blinks for cough.  Laryngoscopy was unremarkable.  Therefore, he underwent barium esophagogram which revealed moderate esophageal dysmotility with tertiary contractions and therefore referred to GI for further evaluation.  Based on the esophagogram, patient conveniently swallowed 13 mm barium tablet, passed into the stomach freely.  Patient denies any GI symptoms.  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   CAD (coronary artery disease), native coronary artery 01/20/2014   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   Overview:  Medtronic Advisa   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   Elevated prostate specific antigen (PSA) 01/24/2013   Gastroesophageal reflux disease 07/17/2014   History of biliary T-tube placement 03/04/2014   Overview:  Overview:  EF>55%, mild LVH, mod MR, mod TR, RVSP=55mHg  TTE 8/26./2015   History of lung cancer 04/30/2017   Hyperglyceridemia 01/20/2014   Hypertension, benign 01/20/2014   Kidney stone 07/17/2014   Nephrolithiasis 04/30/2017   Paroxysmal atrial fibrillation ([place] 07/17/2014   Overview:  Symptomatic, Drug Refractory; SSS with dual chamber pacemaker in situ  Overview:  Overview:  Symptomatic, Drug Refractory; SSS with dual chamber pacemaker in situ   Personal history of malignant neoplasm of bronchus and lung 01/20/2014   Pre-syncope 07/17/2014   Presence of cardiac pacemaker 06/15/2014   Overview:  Overview:  Medtronic Advisa   Pulmonary hypertension ([place] 07/17/2014   Overview:  RVSP=419mG by TTE 03/04/2014  Overview:  Overview:  RVSP=4788m by TTE 03/04/2014   Pure hypertriglyceridemia 07/17/2014   Seasonal allergic rhinitis 07/17/2014   Overview:  Overview:  allergic rhinitis   SI (sacroiliac) joint dysfunction 08/10/2014   Sick sinus syndrome ([place]/02/2015   Overview:  Overview:  sinoatrial node dysfunction with dual chamber pacemaker in situ; paroxysmal AFib   Sinoatrial node dysfunction ([place]/14/2015   Syncope and collapse 01/20/2014    Past Surgical History:  Procedure Laterality Date   CARDIAC ELECTROPHYSIOLOGY MAPPING AND ABLATION     LEFT HEART CATH AND CORONARY ANGIOGRAPHY Left 09/20/2017   Procedure: LEFT HEART CATH AND CORONARY ANGIOGRAPHY;  Surgeon: CalYolonda KidaD;  [facility];  Service: Cardiovascular;  Laterality: Left;   LUNG LOBECTOMY     PACEMAKER IMPLANT  2014     Current Outpatient Medications:    acetaminophen (TYLENOL) 325 MG tablet, Take 325 mg by mouth every 4 (four) hours as needed., Disp: , Rfl:  albuterol (VENTOLIN HFA) 108 (90 Base) MCG/ACT inhaler, Inhale 2 puffs into the lungs every 6 (six) hours as needed for wheezing or shortness of breath., Disp: 8 g, Rfl: 0   amitriptyline (ELAVIL) 50 MG tablet, Take 1 tablet (50 mg total) by mouth at bedtime., Disp: 90 tablet, Rfl: 3   amLODipine  (NORVASC) 5 MG tablet, Take 5 mg by mouth., Disp: , Rfl:    atorvastatin (LIPITOR) 40 MG tablet, Take 40 mg by mouth daily., Disp: , Rfl:    cetirizine (ZYRTEC ALLERGY) 10 MG tablet, 1 tablet Orally At bedtime for 30 day(s), Disp: , Rfl:    cyanocobalamin 1000 MCG tablet, Take 1,000 mcg by mouth daily., Disp: , Rfl:    finasteride (PROSCAR) 5 MG tablet, Take 1 tablet (5 mg total) by mouth daily., Disp: 90 tablet, Rfl: 3   FLOMAX 0.4 MG CAPS capsule, Take 0.4 mg by mouth daily., Disp: , Rfl:    gabapentin (NEURONTIN) 100 MG capsule, Take 100 mg by mouth at bedtime., Disp: , Rfl:    hydrocortisone (CORTEF) 10 MG tablet, Take 1 tablets at 8am and 1 tablet at 3pm, Disp: 90 tablet, Rfl: 0   losartan (COZAAR) 25 MG tablet, Take 25 mg by mouth daily with breakfast., Disp: , Rfl:    Multiple Vitamin (MULTIVITAMIN ADULT PO), Take 1 tablet by mouth daily., Disp: , Rfl:    omeprazole (PRILOSEC) 20 MG capsule, Take 20 mg by mouth at bedtime. , Disp: , Rfl:    rosuvastatin (CRESTOR) 20 MG tablet, Take 20 mg by mouth daily., Disp: , Rfl: 3   simvastatin (ZOCOR) 40 MG tablet, Take 1 tablet by mouth daily., Disp: , Rfl:    sotalol (BETAPACE) 80 MG tablet, Take 80 mg by mouth 2 (two) times daily., Disp: , Rfl:    warfarin (COUMADIN) 5 MG tablet, Take 5 mg by mouth daily at 4 PM., Disp: , Rfl:    Family History  Problem Relation Age of Onset   Bladder Cancer Neg Hx    Kidney cancer Neg Hx    Prostate cancer Neg Hx      Social History   Tobacco Use   Smoking status: Former   Smokeless tobacco: Former  Scientific laboratory technician Use: Never used  Substance Use Topics   Alcohol use: No   Drug use: No    Allergies as of 08/02/2022 - Review Complete 08/02/2022  Allergen Reaction Noted   Lisinopril  06/23/2020   Sulfa antibiotics Other (See Comments) 01/20/2014    Review of Systems:    All systems reviewed and negative except where noted in HPI.   Physical Exam:  BP (!) 172/95 (BP Location: Right Arm,  Patient Position: Sitting, Cuff Size: Normal)   Pulse 80   Temp 97.8 F (36.6 C) (Oral)   Ht '5\' 7"'$  (1.702 m)   Wt 153 lb 8 oz (69.6 kg)   BMI 24.04 kg/m  No LMP for male patient.  General:   Alert,  Well-developed, well-nourished, pleasant and cooperative in NAD Head:  Normocephalic and atraumatic. Eyes:  Sclera clear, no icterus.   Conjunctiva pink. Ears:  Normal auditory acuity. Nose:  No deformity, discharge, or lesions. Mouth:  No deformity or lesions,oropharynx pink & moist. Neck:  Supple; no masses or thyromegaly. Lungs:  Respirations even and unlabored.  Clear throughout to auscultation.   No wheezes, crackles, or rhonchi. No acute distress. Heart:  Regular rate and rhythm; no murmurs, clicks, rubs, or gallops. Abdomen:  Normal bowel sounds. Soft, non-tender and non-distended without masses, hepatosplenomegaly or hernias noted.  No guarding or rebound tenderness.   Rectal: Not performed Msk:  Symmetrical without gross deformities. Good, equal movement & strength bilaterally. Pulses:  Normal pulses noted. Extremities:  No clubbing or edema.  No cyanosis. Neurologic:  Alert and oriented x3;  grossly normal neurologically. Skin:  Intact without significant lesions or rashes. No jaundice. Psych:  Alert and cooperative. Normal mood and affect.  Imaging Studies: Reviewed  Assessment and Plan:   PETROS AHART is a 82 y.o. male with history of A-fib on Coumadin, coronary artery disease, s/p pacemaker for sick sinus syndrome, chronic GERD, hypertension, hyperlipidemia is seen in consultation for abnormal barium esophagogram  Recommend EGD for further evaluation after obtaining cardiac clearance and clearance for interruption of Coumadin   Follow up based on the EGD findings   Wayne Darby, MD

## 2022-08-03 ENCOUNTER — Other Ambulatory Visit: Payer: PRIVATE HEALTH INSURANCE

## 2022-08-04 ENCOUNTER — Telehealth: Payer: Self-pay

## 2022-08-04 NOTE — Telephone Encounter (Signed)
Patient needs to stop the Warfarin 5 days before procedure and then restart it 2 days before procedure. Called and informed patient and he verbalized understanding of instructions

## 2022-08-17 ENCOUNTER — Ambulatory Visit: Payer: Medicare Other | Admitting: Anesthesiology

## 2022-08-17 ENCOUNTER — Encounter: Payer: Self-pay | Admitting: Gastroenterology

## 2022-08-17 ENCOUNTER — Ambulatory Visit
Admission: RE | Admit: 2022-08-17 | Discharge: 2022-08-17 | Disposition: A | Discharge status: Home / Self Care | Payer: Medicare Other | Attending: Gastroenterology | Admitting: Gastroenterology

## 2022-08-17 ENCOUNTER — Encounter
Admission: RE | Disposition: A | Discharge status: Home / Self Care | Payer: Self-pay | Source: Home / Self Care | Attending: Gastroenterology

## 2022-08-17 DIAGNOSIS — K2970 Gastritis, unspecified, without bleeding: Secondary | ICD-10-CM | POA: Insufficient documentation

## 2022-08-17 DIAGNOSIS — Z7901 Long term (current) use of anticoagulants: Secondary | ICD-10-CM | POA: Diagnosis not present

## 2022-08-17 DIAGNOSIS — K319 Disease of stomach and duodenum, unspecified: Secondary | ICD-10-CM

## 2022-08-17 DIAGNOSIS — K449 Diaphragmatic hernia without obstruction or gangrene: Secondary | ICD-10-CM | POA: Insufficient documentation

## 2022-08-17 DIAGNOSIS — E781 Pure hyperglyceridemia: Secondary | ICD-10-CM | POA: Insufficient documentation

## 2022-08-17 DIAGNOSIS — Z950 Presence of cardiac pacemaker: Secondary | ICD-10-CM | POA: Insufficient documentation

## 2022-08-17 DIAGNOSIS — M1990 Unspecified osteoarthritis, unspecified site: Secondary | ICD-10-CM | POA: Insufficient documentation

## 2022-08-17 DIAGNOSIS — I509 Heart failure, unspecified: Secondary | ICD-10-CM | POA: Diagnosis not present

## 2022-08-17 DIAGNOSIS — R933 Abnormal findings on diagnostic imaging of other parts of digestive tract: Secondary | ICD-10-CM | POA: Diagnosis not present

## 2022-08-17 DIAGNOSIS — Z79899 Other long term (current) drug therapy: Secondary | ICD-10-CM | POA: Insufficient documentation

## 2022-08-17 DIAGNOSIS — K317 Polyp of stomach and duodenum: Secondary | ICD-10-CM

## 2022-08-17 DIAGNOSIS — I110 Hypertensive heart disease with heart failure: Secondary | ICD-10-CM | POA: Insufficient documentation

## 2022-08-17 DIAGNOSIS — Z87891 Personal history of nicotine dependence: Secondary | ICD-10-CM | POA: Diagnosis not present

## 2022-08-17 DIAGNOSIS — K219 Gastro-esophageal reflux disease without esophagitis: Secondary | ICD-10-CM

## 2022-08-17 DIAGNOSIS — I2510 Atherosclerotic heart disease of native coronary artery without angina pectoris: Secondary | ICD-10-CM | POA: Diagnosis not present

## 2022-08-17 DIAGNOSIS — I480 Paroxysmal atrial fibrillation: Secondary | ICD-10-CM | POA: Insufficient documentation

## 2022-08-17 DIAGNOSIS — Z85118 Personal history of other malignant neoplasm of bronchus and lung: Secondary | ICD-10-CM | POA: Diagnosis not present

## 2022-08-17 HISTORY — PX: ESOPHAGOGASTRODUODENOSCOPY (EGD) WITH PROPOFOL: SHX5813

## 2022-08-17 SURGERY — ESOPHAGOGASTRODUODENOSCOPY (EGD) WITH PROPOFOL
Anesthesia: General

## 2022-08-17 MED ORDER — PROPOFOL 10 MG/ML IV BOLUS
INTRAVENOUS | Status: DC | PRN
  Administered 2022-08-17: 30 mg via INTRAVENOUS
  Administered 2022-08-17: 70 mg via INTRAVENOUS
  Administered 2022-08-17: 30 mg via INTRAVENOUS

## 2022-08-17 MED ORDER — GLYCOPYRROLATE 0.2 MG/ML IJ SOLN
INTRAMUSCULAR | Status: DC | PRN
  Administered 2022-08-17: .2 mg via INTRAVENOUS

## 2022-08-17 MED ORDER — PROPOFOL 500 MG/50ML IV EMUL
INTRAVENOUS | Status: DC | PRN
  Administered 2022-08-17: 145 ug/kg/min via INTRAVENOUS

## 2022-08-17 MED ORDER — LIDOCAINE HCL (CARDIAC) PF 100 MG/5ML IV SOSY
PREFILLED_SYRINGE | INTRAVENOUS | Status: DC | PRN
  Administered 2022-08-17: 100 mg via INTRAVENOUS

## 2022-08-17 MED ORDER — EPHEDRINE SULFATE (PRESSORS) 50 MG/ML IJ SOLN
INTRAMUSCULAR | Status: DC | PRN
  Administered 2022-08-17: 10 mg via INTRAVENOUS

## 2022-08-17 MED ORDER — SODIUM CHLORIDE 0.9 % IV SOLN
INTRAVENOUS | Status: DC
  Administered 2022-08-17: 1000 mL via INTRAVENOUS

## 2022-08-17 NOTE — Anesthesia Preprocedure Evaluation (Addendum)
Anesthesia Evaluation  Patient identified by MRN, date of birth, ID band Patient awake    Reviewed: Allergy & Precautions, NPO status , Patient's Chart, lab work & pertinent test results  Airway Mallampati: IV  TM Distance: >3 FB Neck ROM: full    Dental  (+) Upper Dentures, Edentulous Lower   Pulmonary former smoker   Pulmonary exam normal        Cardiovascular hypertension, + angina  + CAD and +CHF  Normal cardiovascular exam+ pacemaker   09/2021 ECHO IMPRESSIONS     1. Left ventricular ejection fraction, by estimation, is 65 to 70%. The  left ventricle has normal function. The left ventricle has no regional  wall motion abnormalities. Left ventricular diastolic parameters were  normal.   2. Right ventricular systolic function is normal. The right ventricular  size is normal.   3. The mitral valve is normal in structure. Trivial mitral valve  regurgitation. No evidence of mitral stenosis.   4. The aortic valve is normal in structure. Aortic valve regurgitation is  not visualized. No aortic stenosis is present.   5. The inferior vena cava is normal in size with greater than 50%  respiratory variability, suggesting right atrial pressure of 3 mmHg.      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: Atrial fibrillation ([place]) 07/17/2014: 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: CAD  (coronary artery disease), native coronary artery 06/15/2014: Cardiac pacemaker     Comment:  Overview:  Medtronic Advisa 01/24/2013: 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: Elevated prostate specific antigen (PSA) 07/17/2014: Gastroesophageal reflux disease 03/04/2014: History of biliary T-tube placement     Comment:  Overview:  Overview:  EF>55%, mild LVH, mod MR, mod TR,               RVSP=71mHg TTE 8/26./2015 04/30/2017: History of lung cancer 01/20/2014: Hyperglyceridemia 01/20/2014: Hypertension, benign 07/17/2014: Kidney stone 04/30/2017: Nephrolithiasis 07/17/2014: Paroxysmal atrial fibrillation ([place]     Comment:  Overview:  Symptomatic, Drug Refractory; SSS with dual               chamber pacemaker in situ  Overview:  Overview:                Symptomatic, Drug Refractory; SSS with dual chamber               pacemaker in situ 01/20/2014: Personal history of malignant neoplasm of bronchus and lung 07/17/2014: Pre-syncope 06/15/2014: Presence of cardiac pacemaker     Comment:  Overview:  Overview:  Medtronic Advisa 07/17/2014: Pulmonary hypertension ([place]     Comment:  Overview:  RVSP=41mG by TTE 03/04/2014  Overview:                Overview:  RVSP=4730m by TTE 03/04/2014 07/17/2014: Pure hypertriglyceridemia 07/17/2014: Seasonal allergic rhinitis     Comment:  Overview:  Overview:  allergic rhinitis 08/10/2014: SI (sacroiliac) joint dysfunction 07/17/2014: Sick sinus syndrome ([place]   Comment:  Overview:  Overview:  sinoatrial node dysfunction with               dual chamber pacemaker in situ; paroxysmal AFib 01/20/2014: Sinoatrial node dysfunction ([place]) 01/20/2014: Syncope and collapse  Past Surgical History: No date: CARDIAC ELECTROPHYSIOLOGY MAPPING AND ABLATION 09/20/2017: LEFT HEART CATH AND CORONARY ANGIOGRAPHY; Left     Comment:  Procedure: LEFT HEART CATH AND CORONARY ANGIOGRAPHY;                Surgeon: Yolonda Kida, MD;  Location: [facility];  Service:  Cardiovascular;  Laterality: Left; No date: LUNG LOBECTOMY 2014: PACEMAKER IMPLANT     Reproductive/Obstetrics negative OB ROS                             Anesthesia Physical Anesthesia Plan  ASA: 3  Anesthesia Plan: General   Post-op Pain Management:    Induction: Intravenous  PONV Risk Score and Plan: Propofol infusion and TIVA  Airway Management Planned: Natural Airway and Nasal Cannula  Additional Equipment:   Intra-op Plan:   Post-operative Plan:   Informed Consent: I have reviewed the patients History and Physical, chart, labs and discussed the procedure including the risks, benefits and alternatives for the proposed anesthesia with the patient or authorized representative who has indicated his/her understanding and acceptance.     Dental Advisory Given  Plan Discussed with: Anesthesiologist, CRNA and Surgeon  Anesthesia Plan Comments: (Patient consented for risks of anesthesia including but not limited to:  - adverse reactions to medications - risk of airway placement if required - damage to eyes, teeth, lips or other oral mucosa - nerve damage due to positioning  - sore throat or hoarseness - Damage to heart, brain, nerves, lungs, other parts of body or loss of life  Patient voiced understanding.)       Anesthesia Quick Evaluation

## 2022-08-17 NOTE — Transfer of Care (Signed)
Immediate Anesthesia Transfer of Care Note  Patient: Wayne Marquez  Procedure(s) Performed: ESOPHAGOGASTRODUODENOSCOPY (EGD) WITH PROPOFOL  Patient Location: Endoscopy Unit  Anesthesia Type:General  Level of Consciousness: drowsy and patient cooperative  Airway & Oxygen Therapy: Patient Spontanous Breathing and Patient connected to face mask oxygen  Post-op Assessment: Report given to RN and Post -op Vital signs reviewed and stable  Post vital signs: Reviewed and stable  Last Vitals:  Vitals Value Taken Time  BP 77/56 08/17/22 1227  Temp    Pulse 65 08/17/22 1227  Resp 13 08/17/22 1227  SpO2 100 % 08/17/22 1227  Vitals shown include unvalidated device data.  Last Pain:  Vitals:   08/17/22 1010  TempSrc: Temporal  PainSc: 7          Complications: No notable events documented.

## 2022-08-17 NOTE — Op Note (Signed)
[facility] Gastroenterology Patient Name: Wayne Marquez Procedure Date: 08/17/2022 12:05 PM MRN: [id number] Account #: [id number] Date of Birth: [birth year] Admit Type: Outpatient Age: 82 Room: [facility] ENDO ROOM 3 Gender: Male Note Status: Finalized Instrument Name: Upper Endoscope [id number] Procedure:             Upper GI endoscopy Indications:           Follow-up of gastro-esophageal reflux disease,                         Abnormal abdominal x-ray of the GI tract Providers:             Lin Landsman MD, MD Referring MD:          Alinda Deem (Referring MD) Medicines:             General Anesthesia Complications:         No immediate complications. Estimated blood loss: None. Procedure:             Pre-Anesthesia Assessment:                        - Prior to the procedure, a History and Physical was                         performed, and patient medications and allergies were                         reviewed. The patient is competent. The risks and                         benefits of the procedure and the sedation options and                         risks were discussed with the patient. All questions                         were answered and informed consent was obtained.                         Patient identification and proposed procedure were                         verified by the physician, the nurse, the                         anesthesiologist, the anesthetist and the technician                         in the pre-procedure area in the procedure room in the                         endoscopy suite. Mental Status Examination: alert and                         oriented. Airway Examination: normal oropharyngeal                         airway and neck mobility. Respiratory Examination:  clear to auscultation. CV Examination: normal.                         Prophylactic Antibiotics: The patient does not require                          prophylactic antibiotics. Prior Anticoagulants: The                         patient has taken Coumadin (warfarin), last dose was 5                         days prior to procedure. ASA Grade Assessment: III - A                         patient with severe systemic disease. After reviewing                         the risks and benefits, the patient was deemed in                         satisfactory condition to undergo the procedure. The                         anesthesia plan was to use general anesthesia.                         Immediately prior to administration of medications,                         the patient was re-assessed for adequacy to receive                         sedatives. The heart rate, respiratory rate, oxygen                         saturations, blood pressure, adequacy of pulmonary                         ventilation, and response to care were monitored                         throughout the procedure. The physical status of the                         patient was re-assessed after the procedure.                        After obtaining informed consent, the endoscope was                         passed under direct vision. Throughout the procedure,                         the patient's blood pressure, pulse, and oxygen                         saturations were monitored continuously. The Endoscope  was introduced through the mouth, and advanced to the                         second part of duodenum. The upper GI endoscopy was                         accomplished without difficulty. The patient tolerated                         the procedure well. Findings:      A single 3 mm sessile polyp was found in the second portion of the       duodenum. The polyp was removed with a cold snare. Resection and       retrieval were complete. Estimated blood loss: none.      The duodenal bulb and second portion of the duodenum were normal.      Diffuse mildly  erythematous mucosa without bleeding was found in the       gastric body and in the gastric antrum. Biopsies were taken with a cold       forceps for Helicobacter pylori testing.      The cardia and gastric fundus were normal on retroflexion.      Esophagogastric landmarks were identified: the gastroesophageal junction       was found at 38 cm from the incisors.      The gastroesophageal junction and examined esophagus were normal.      A small hiatal hernia was present. Impression:            - A single duodenal polyp. Resected and retrieved.                        - Normal duodenal bulb and second portion of the                         duodenum.                        - Erythematous mucosa in the gastric body and antrum.                         Biopsied.                        - Esophagogastric landmarks identified.                        - Normal gastroesophageal junction and esophagus.                        - Small hiatal hernia. Recommendation:        - Discharge patient to home (with escort).                        - Resume previous diet today.                        - Continue present medications.                        - Await pathology results.                        -  Resume Coumadin (warfarin) at prior dose tomorrow.                         Refer to managing physician for further adjustment of                         therapy. Procedure Code(s):     --- Professional ---                        [phone], Esophagogastroduodenoscopy, flexible,                         transoral; with removal of tumor(s), polyp(s), or                         other lesion(s) by snare technique                        43239, 21, Esophagogastroduodenoscopy, flexible,                         transoral; with biopsy, single or multiple Diagnosis Code(s):     --- Professional ---                        K31.7, Polyp of stomach and duodenum                        K31.89, Other diseases of stomach and  duodenum                        R93.3, Abnormal findings on diagnostic imaging of                         other parts of digestive tract                        K21.9, Gastro-esophageal reflux disease without                         esophagitis CPT copyright 2022 [employer] Association. All rights reserved. The codes documented in this report are preliminary and upon coder review may  be revised to meet current compliance requirements. Dr. Ulyess Mort Lin Landsman MD, MD 08/17/2022 12:26:25 PM This report has been signed electronically. Number of Addenda: 0 Note Initiated On: 08/17/2022 12:05 PM Estimated Blood Loss:  Estimated blood loss: none.      [facility]

## 2022-08-17 NOTE — Anesthesia Procedure Notes (Signed)
Procedure Name: General with mask airway Date/Time: 08/17/2022 12:20 PM  Performed by: Kelton Pillar, CRNAPre-anesthesia Checklist: Patient identified, Emergency Drugs available, Suction available and Patient being monitored Patient Re-evaluated:Patient Re-evaluated prior to induction Oxygen Delivery Method: Simple face mask Induction Type: IV induction Placement Confirmation: positive ETCO2, CO2 detector and breath sounds checked- equal and bilateral Dental Injury: Teeth and Oropharynx as per pre-operative assessment

## 2022-08-17 NOTE — H&P (Signed)
Cephas Darby, MD [address]  [employer]  [place], [place] [address]  Main: [phone]  Fax: [phone] Pager: [phone]  Primary Care Physician:  Renee Rival, NP Primary Gastroenterologist:  Dr. Cephas Darby  Pre-Procedure History & Physical: HPI:  Wayne Marquez is a 82 y.o. male is here for an endoscopy.   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   Elevated prostate specific antigen (PSA) 01/24/2013   Gastroesophageal reflux disease 07/17/2014   History of biliary T-tube placement 03/04/2014   Overview:  Overview:  EF>55%, mild LVH, mod MR, mod TR, RVSP=66mHg TTE 8/26./2015   History of lung cancer 04/30/2017   Hyperglyceridemia 01/20/2014   Hypertension, benign 01/20/2014   Kidney stone 07/17/2014   Nephrolithiasis 04/30/2017   Paroxysmal atrial fibrillation ([place] 07/17/2014   Overview:  Symptomatic, Drug Refractory; SSS with dual chamber pacemaker in situ  Overview:  Overview:  Symptomatic, Drug Refractory; SSS with dual chamber pacemaker in situ   Personal history of malignant neoplasm of bronchus and lung 01/20/2014   Pre-syncope 07/17/2014   Presence of cardiac pacemaker 06/15/2014   Overview:  Overview:  Medtronic Advisa   Pulmonary hypertension ([place] 07/17/2014   Overview:  RVSP=482mG by TTE 03/04/2014  Overview:  Overview:  RVSP=4750m by TTE 03/04/2014   Pure hypertriglyceridemia 07/17/2014   Seasonal allergic rhinitis 07/17/2014   Overview:  Overview:  allergic rhinitis   SI (sacroiliac)  joint dysfunction 08/10/2014   Sick sinus syndrome ([place]/02/2015   Overview:  Overview:  sinoatrial node dysfunction with dual chamber pacemaker in situ; paroxysmal AFib   Sinoatrial node dysfunction ([facility]/14/2015   Syncope and collapse 01/20/2014    Past Surgical History:  Procedure Laterality Date   CARDIAC ELECTROPHYSIOLOGY MAPPING AND ABLATION     LEFT HEART CATH AND CORONARY ANGIOGRAPHY Left 09/20/2017   Procedure: LEFT HEART CATH AND CORONARY ANGIOGRAPHY;  Surgeon: CalYolonda KidaD;  [facility];  Service: Cardiovascular;  Laterality: Left;   LUNG LOBECTOMY     PACEMAKER IMPLANT  2014    Prior to Admission medications   Medication Sig Start Date End Date Taking? Authorizing Provider  acetaminophen (TYLENOL) 325 MG tablet Take 325 mg by mouth every 4 (four) hours as needed.   Yes [provider]  albuterol (VENTOLIN HFA) 108 (90 Base) MCG/ACT inhaler Inhale 2 puffs into the lungs every 6 (six) hours as needed for wheezing or shortness of breath. 06/30/22  Yes JesBlake DivineD  amitriptyline (ELAVIL) 50 MG tablet Take 1 tablet (50 mg total) by mouth at bedtime. 11/03/21  Yes Stoioff, ScoRonda FairlyD  amLODipine (NORVASC) 5 MG tablet Take 5 mg by mouth. 09/02/21 09/02/22 Yes [provider]  atorvastatin (LIPITOR) 40 MG tablet Take 40 mg by mouth daily. 02/17/22  Yes [provider]  cetirizine (ZYRTEC ALLERGY) 10 MG tablet 1 tablet Orally At bedtime for 30 day(s) 03/16/21  Yes [provider]  cyanocobalamin 1000 MCG tablet Take 1,000 mcg by mouth daily.  Yes [provider]  finasteride (PROSCAR) 5 MG tablet Take 1 tablet (5 mg total) by mouth daily. 07/22/21  Yes Stoioff, Ronda Fairly, MD  FLOMAX 0.4 MG CAPS capsule Take 0.4 mg by mouth daily.   Yes [provider]  gabapentin (NEURONTIN) 100 MG capsule Take 100 mg by mouth at bedtime. 01/23/20  Yes [provider]  hydrocortisone (CORTEF) 10 MG tablet Take 1  tablets at 8am and 1 tablet at 3pm 10/26/21  Yes Nida, Marella Chimes, MD  losartan (COZAAR) 25 MG tablet Take 25 mg by mouth daily with breakfast.   Yes [provider]  Multiple Vitamin (MULTIVITAMIN ADULT PO) Take 1 tablet by mouth daily.   Yes [provider]  omeprazole (PRILOSEC) 20 MG capsule Take 20 mg by mouth at bedtime.  03/05/17  Yes [provider]  rosuvastatin (CRESTOR) 20 MG tablet Take 20 mg by mouth daily. 04/27/18  Yes [provider]  simvastatin (ZOCOR) 40 MG tablet Take 1 tablet by mouth daily.   Yes [provider]  sotalol (BETAPACE) 80 MG tablet Take 80 mg by mouth 2 (two) times daily. 09/05/21  Yes [provider]  warfarin (COUMADIN) 5 MG tablet Take 5 mg by mouth daily at 4 PM. 09/10/18 08/02/22  [provider]    Allergies as of 08/02/2022 - Review Complete 08/02/2022  Allergen Reaction Noted   Lisinopril  06/23/2020   Sulfa antibiotics Other (See Comments) 01/20/2014    Family History  Problem Relation Age of Onset   Bladder Cancer Neg Hx    Kidney cancer Neg Hx    Prostate cancer Neg Hx     Social History   Socioeconomic History   Marital status: Married    Spouse name: Not on file   Number of children: Not on file   Years of education: Not on file   Highest education level: Not on file  Occupational History   Not on file  Tobacco Use   Smoking status: Former   Smokeless tobacco: Former  Scientific laboratory technician Use: Never used  Substance and Sexual Activity   Alcohol use: No   Drug use: No   Sexual activity: Not on file  Other Topics Concern   Not on file  Social History Narrative   Not on file   Social Determinants of Health   Financial Resource Strain: Not on file  Food Insecurity: Not on file  Transportation Needs: Not on file  Physical Activity: Not on file  Stress: Not on file  Social Connections: Not on file  Intimate Partner Violence: Not on file    Review of  Systems: See HPI, otherwise negative ROS  Physical Exam: BP (!) 173/95   Pulse 88   Temp (!) 96.8 F (36 C) (Temporal)   Resp 17   Ht '5\' 7"'$  (1.702 m)   Wt 68.3 kg   SpO2 100%   BMI 23.59 kg/m  General:   Alert,  pleasant and cooperative in NAD Head:  Normocephalic and atraumatic. Neck:  Supple; no masses or thyromegaly. Lungs:  Clear throughout to auscultation.    Heart:  Regular rate and rhythm. Abdomen:  Soft, nontender and nondistended. Normal bowel sounds, without guarding, and without rebound.   Neurologic:  Alert and  oriented x4;  grossly normal neurologically.  Impression/Plan: Wayne Marquez is here for an endoscopy to be performed for abnormal barium esophagogram   Risks, benefits, limitations, and alternatives regarding  endoscopy have been reviewed  with the patient.  Questions have been answered.  All parties agreeable.   Sherri Sear, MD  08/17/2022, 10:50 AM

## 2022-08-17 NOTE — Anesthesia Postprocedure Evaluation (Signed)
Anesthesia Post Note  Patient: [name]  Procedure(s) Performed: ESOPHAGOGASTRODUODENOSCOPY (EGD) WITH PROPOFOL  Patient location during evaluation: Endoscopy Anesthesia Type: General Level of consciousness: awake and alert Pain management: pain level controlled Vital Signs Assessment: post-procedure vital signs reviewed and stable Respiratory status: spontaneous breathing, nonlabored ventilation and respiratory function stable Cardiovascular status: blood pressure returned to baseline and stable Postop Assessment: no apparent nausea or vomiting Anesthetic complications: no   No notable events documented.   Last Vitals:  Vitals:   08/17/22 1254 08/17/22 1256  BP: 123/74 123/64  Pulse: 63 62  Resp: (!) 21 (!) 23  Temp:    SpO2: 98% 99%    Last Pain:  Vitals:   08/17/22 1254  TempSrc:   PainSc: 0-No pain                 Alphonsus Sias

## 2022-08-18 ENCOUNTER — Encounter: Payer: Self-pay | Admitting: Gastroenterology

## 2022-08-21 LAB — SURGICAL PATHOLOGY

## 2022-08-22 ENCOUNTER — Telehealth: Payer: Self-pay

## 2022-08-22 NOTE — Telephone Encounter (Signed)
-----   Message from Lin Landsman, MD sent at 08/22/2022 10:35 AM EST ----- Please inform patient that the pathology results from upper endoscopy came back normal.  He can continue taking omeprazole 20 mg for reflux.  If he does not have any symptoms, he can stop the omeprazole  [employer]

## 2022-08-22 NOTE — Telephone Encounter (Signed)
Patient verbalized understanding of results  

## 2022-08-23 ENCOUNTER — Encounter: Payer: Self-pay | Admitting: Urology

## 2022-08-23 ENCOUNTER — Ambulatory Visit (INDEPENDENT_AMBULATORY_CARE_PROVIDER_SITE_OTHER): Payer: Medicare Other | Admitting: Urology

## 2022-08-23 VITALS — BP 138/74 | HR 68 | Ht 70.0 in | Wt 150.0 lb

## 2022-08-23 DIAGNOSIS — N401 Enlarged prostate with lower urinary tract symptoms: Secondary | ICD-10-CM | POA: Diagnosis not present

## 2022-08-23 DIAGNOSIS — G894 Chronic pain syndrome: Secondary | ICD-10-CM

## 2022-08-23 DIAGNOSIS — N138 Other obstructive and reflux uropathy: Secondary | ICD-10-CM

## 2022-08-23 DIAGNOSIS — N411 Chronic prostatitis: Secondary | ICD-10-CM | POA: Diagnosis not present

## 2022-08-23 LAB — BLADDER SCAN AMB NON-IMAGING: Scan Result: 180

## 2022-08-23 MED ORDER — AMITRIPTYLINE HCL 50 MG PO TABS: 50.0000 mg | ORAL_TABLET | Freq: Every day | ORAL | 3 refills | Status: DC

## 2022-08-23 MED ORDER — FINASTERIDE 5 MG PO TABS: 5.0000 mg | ORAL_TABLET | Freq: Every day | ORAL | 3 refills | Status: DC

## 2022-08-23 NOTE — Progress Notes (Signed)
08/23/2022 2:52 PM   Wayne Marquez 01-16-1941 [id number]  Referring provider: Renee Rival, NP [address] [place],  [place] [address]  Chief Complaint  Patient presents with   Benign Prostatic Hypertrophy    Urologic history: 1.  Chronic prostatitis/chronic pelvic pain syndrome -Diagnosed mid 1990s; well managed on amitriptyline   2.  BPH with lower urinary tract symptoms -Stable on combination therapy tamsulosin/finasteride -Tamsulosin discontinued by PCP 07/2022 secondary to side effects -Cystoscopy 02/2017 coapting lateral lobes and moderate intravesical median lobe   3.  Elevated PSA -Prostate biopsy 2002 PSA 6.3 with benign pathology  -Uncorrected PSA has been stable and he elected to discontinue PSA testing in 2018   HPI: 82 y.o. male presents for annual follow-up.  No significant problems since last years visit Stable LUTS States tamsulosin was discontinued by PCP secondary to blood pressure elevation.  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   Elevated prostate specific antigen (PSA) 01/24/2013   Gastroesophageal reflux disease 07/17/2014   History of biliary T-tube placement 03/04/2014   Overview:  Overview:  EF>55%, mild LVH, mod MR, mod TR, RVSP=62mHg TTE 8/26./2015   History of lung cancer 04/30/2017   Hyperglyceridemia 01/20/2014   Hypertension, benign 01/20/2014   Kidney stone 07/17/2014    Nephrolithiasis 04/30/2017   Paroxysmal atrial fibrillation ([place] 07/17/2014   Overview:  Symptomatic, Drug Refractory; SSS with dual chamber pacemaker in situ  Overview:  Overview:  Symptomatic, Drug Refractory; SSS with dual chamber pacemaker in situ   Personal history of malignant neoplasm of bronchus and lung 01/20/2014   Pre-syncope 07/17/2014   Presence of cardiac pacemaker 06/15/2014   Overview:  Overview:  Medtronic Advisa   Pulmonary hypertension ([place] 07/17/2014   Overview:  RVSP=466mG by TTE 03/04/2014  Overview:  Overview:  RVSP=4758m by TTE 03/04/2014   Pure hypertriglyceridemia 07/17/2014   Seasonal allergic rhinitis 07/17/2014   Overview:  Overview:  allergic rhinitis   SI (sacroiliac) joint dysfunction 08/10/2014   Sick sinus syndrome ([facility]/02/2015   Overview:  Overview:  sinoatrial node dysfunction with dual chamber pacemaker in situ; paroxysmal AFib   Sinoatrial node dysfunction ([facility]/14/2015   Syncope and collapse 01/20/2014    Surgical History: Past Surgical History:  Procedure Laterality Date   CARDIAC ELECTROPHYSIOLOGY MAPPING AND ABLATION     ESOPHAGOGASTRODUODENOSCOPY (EGD) WITH PROPOFOL N/A 08/17/2022   Procedure: ESOPHAGOGASTRODUODENOSCOPY (EGD) WITH PROPOFOL;  Surgeon: VanLin LandsmanD;  [facility];  Service: Gastroenterology;  Laterality: N/A;   LEFT HEART CATH AND CORONARY ANGIOGRAPHY Left 09/20/2017   Procedure: LEFT HEART CATH AND CORONARY ANGIOGRAPHY;  Surgeon: CalYolonda KidaD;  [facility];  Service: Cardiovascular;  Laterality: Left;   LUNG LOBECTOMY     PACEMAKER IMPLANT  2014    Home Medications:  Allergies as of 08/23/2022       Reactions   Lisinopril    Other reaction(s): dont remember  Sulfa Antibiotics Other (See Comments)   GI UPSET        Medication List        Accurate as of August 23, 2022  2:52 PM. If you have any questions, ask your nurse or doctor.          STOP taking these  medications    Flomax 0.4 MG Caps capsule Generic drug: tamsulosin Stopped by: Abbie Sons, MD       TAKE these medications    acetaminophen 325 MG tablet Commonly known as: TYLENOL Take 325 mg by mouth every 4 (four) hours as needed.   albuterol 108 (90 Base) MCG/ACT inhaler Commonly known as: VENTOLIN HFA Inhale 2 puffs into the lungs every 6 (six) hours as needed for wheezing or shortness of breath.   amitriptyline 50 MG tablet Commonly known as: ELAVIL Take 1 tablet (50 mg total) by mouth at bedtime.   amLODipine 5 MG tablet Commonly known as: NORVASC Take 5 mg by mouth.   atorvastatin 40 MG tablet Commonly known as: LIPITOR Take 40 mg by mouth daily.   cyanocobalamin 1000 MCG tablet Take 1,000 mcg by mouth daily.   finasteride 5 MG tablet Commonly known as: PROSCAR Take 1 tablet (5 mg total) by mouth daily.   gabapentin 100 MG capsule Commonly known as: NEURONTIN Take 100 mg by mouth at bedtime.   hydrocortisone 10 MG tablet Commonly known as: Cortef Take 1 tablets at 8am and 1 tablet at 3pm   losartan 25 MG tablet Commonly known as: COZAAR Take 25 mg by mouth daily with breakfast.   MULTIVITAMIN ADULT PO Take 1 tablet by mouth daily.   omeprazole 20 MG capsule Commonly known as: PRILOSEC Take 20 mg by mouth at bedtime.   rosuvastatin 20 MG tablet Commonly known as: CRESTOR Take 20 mg by mouth daily.   simvastatin 40 MG tablet Commonly known as: ZOCOR Take 1 tablet by mouth daily.   sotalol 80 MG tablet Commonly known as: BETAPACE Take 80 mg by mouth 2 (two) times daily.   warfarin 5 MG tablet Commonly known as: COUMADIN Take 5 mg by mouth daily at 4 PM.   ZyrTEC Allergy 10 MG tablet Generic drug: cetirizine 1 tablet Orally At bedtime for 30 day(s)        Allergies:  Allergies  Allergen Reactions   Lisinopril     Other reaction(s): dont remember   Sulfa Antibiotics Other (See Comments)    GI UPSET    Family  History: Family History  Problem Relation Age of Onset   Bladder Cancer Neg Hx    Kidney cancer Neg Hx    Prostate cancer Neg Hx     Social History:  reports that he has quit smoking. He has quit using smokeless tobacco. He reports that he does not drink alcohol and does not use drugs.   Physical Exam: BP 138/74   Pulse 68   Ht 5' 10"$  (1.778 m)   Wt 150 lb (68 kg)   BMI 21.52 kg/m   Constitutional:  Alert and oriented, No acute distress. HEENT: [place] AT, moist mucus membranes.  Trachea midline, no masses. Cardiovascular: No clubbing, cyanosis, or edema. Respiratory: Normal respiratory effort, no increased work of breathing. Psychiatric: Normal mood and affect.   Assessment & Plan:    1. BPH with obstruction/lower urinary tract symptoms Stable on finasteride; did not see worsening symptoms when tamsulosin was discontinued Refill sent to pharmacy Bladder scan PVR 180 mL  2.  Chronic prostatitis/chronic pelvic pain syndrome Stable Amitriptyline refilled at 50 mg   Abbie Sons, [place] [address], [place], [place] [address] [phone]

## 2022-08-24 LAB — URINALYSIS, COMPLETE
Bilirubin, UA: NEGATIVE
Glucose, UA: NEGATIVE
Ketones, UA: NEGATIVE
Leukocytes,UA: NEGATIVE
Nitrite, UA: NEGATIVE
Protein,UA: NEGATIVE
RBC, UA: NEGATIVE
Specific Gravity, UA: 1.01 (ref 1.005–1.030)
Urobilinogen, Ur: 1 mg/dL (ref 0.2–1.0)
pH, UA: 6.5 (ref 5.0–7.5)

## 2022-08-24 LAB — MICROSCOPIC EXAMINATION: Bacteria, UA: NONE SEEN

## 2022-11-08 ENCOUNTER — Telehealth: Payer: Self-pay | Admitting: "Endocrinology

## 2022-11-08 ENCOUNTER — Other Ambulatory Visit: Payer: Self-pay | Admitting: "Endocrinology

## 2022-11-08 DIAGNOSIS — E2740 Unspecified adrenocortical insufficiency: Secondary | ICD-10-CM

## 2022-11-08 DIAGNOSIS — E042 Nontoxic multinodular goiter: Secondary | ICD-10-CM

## 2022-11-08 NOTE — Telephone Encounter (Signed)
I called and left patient a VM to call back to discuss that he does need an ultrasound and labs prior to his visit on 5/16. Jeani Hawking will call to schedule his ultrasound and his labs have been updated

## 2022-11-23 ENCOUNTER — Ambulatory Visit: Payer: Medicare Other | Admitting: "Endocrinology

## 2023-08-23 ENCOUNTER — Ambulatory Visit: Payer: Medicare Other | Admitting: Urology

## 2023-08-23 VITALS — BP 142/89 | HR 87 | Ht 67.0 in | Wt 152.0 lb

## 2023-08-23 DIAGNOSIS — N411 Chronic prostatitis: Secondary | ICD-10-CM | POA: Diagnosis not present

## 2023-08-23 DIAGNOSIS — N401 Enlarged prostate with lower urinary tract symptoms: Secondary | ICD-10-CM

## 2023-08-23 LAB — URINALYSIS, COMPLETE
Bilirubin, UA: NEGATIVE
Glucose, UA: NEGATIVE
Ketones, UA: NEGATIVE
Leukocytes,UA: NEGATIVE
Nitrite, UA: NEGATIVE
RBC, UA: NEGATIVE
Specific Gravity, UA: 1.025 (ref 1.005–1.030)
Urobilinogen, Ur: 0.2 mg/dL (ref 0.2–1.0)
pH, UA: 5.5 (ref 5.0–7.5)

## 2023-08-23 LAB — MICROSCOPIC EXAMINATION

## 2023-08-23 MED ORDER — AMITRIPTYLINE HCL 50 MG PO TABS
50.0000 mg | ORAL_TABLET | Freq: Every day | ORAL | 3 refills | Status: AC
Start: 2023-08-23 — End: ?

## 2023-08-23 MED ORDER — FINASTERIDE 5 MG PO TABS: 5.0000 mg | ORAL_TABLET | Freq: Every day | ORAL | 3 refills | Status: DC

## 2023-08-23 NOTE — Progress Notes (Signed)
I, Maysun Anabel Bene, acting as a scribe for Riki Altes, MD., have documented all relevant documentation on the behalf of Riki Altes, MD, as directed by Riki Altes, MD while in the presence of Riki Altes, MD.  08/23/2023 2:05 PM   Etter Sjogren [birth year] [id number]  Referring provider: Erasmo Downer, NP [address] [place],  [place] [address]  Chief Complaint  Patient presents with   Benign Prostatic Hypertrophy   Urologic history: 1.  Chronic prostatitis/chronic pelvic pain syndrome Diagnosed mid 1990s; well managed on amitriptyline   2.  BPH with lower urinary tract symptoms Stable on combination therapy tamsulosin/finasteride Tamsulosin discontinued by PCP 07/2022 secondary to side effects Cystoscopy 02/2017 coapting lateral lobes and moderate intravesical median lobe   3.  Elevated PSA Prostate biopsy 2002 PSA 6.3 with benign pathology  Uncorrected PSA has been stable and he elected to discontinue PSA testing in 2018  HPI: Wayne Marquez is a 83 y.o. male presents for annual follow-up.   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 11/27/2016   Atrial fibrillation (HCC) 01/20/2014   BPH with obstruction/lower urinary tract symptoms 07/17/2014   CAD (coronary artery disease), native coronary artery 01/20/2014   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   Overview:  Medtronic Advisa   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   Elevated prostate specific antigen (PSA) 01/24/2013   Gastroesophageal reflux disease 07/17/2014   History of biliary T-tube placement 03/04/2014   Overview:  Overview:  EF>55%, mild LVH, mod MR, mod TR, RVSP=31mmHg TTE  8/26./2015   History of lung cancer 04/30/2017   Hyperglyceridemia 01/20/2014   Hypertension, benign 01/20/2014   Kidney stone 07/17/2014   Nephrolithiasis 04/30/2017   Paroxysmal atrial fibrillation (HCC) 07/17/2014   Overview:  Symptomatic, Drug Refractory; SSS with dual chamber pacemaker in situ  Overview:  Overview:  Symptomatic, Drug Refractory; SSS with dual chamber pacemaker in situ   Personal history of malignant neoplasm of bronchus and lung 01/20/2014   Pre-syncope 07/17/2014   Presence of cardiac pacemaker 06/15/2014   Overview:  Overview:  Medtronic Advisa   Pulmonary hypertension (HCC) 07/17/2014   Overview:  RVSP=45mmHG by TTE 03/04/2014  Overview:  Overview:  RVSP=84mmHG by TTE 03/04/2014   Pure hypertriglyceridemia 07/17/2014   Seasonal allergic rhinitis 07/17/2014   Overview:  Overview:  allergic rhinitis   SI (sacroiliac) joint dysfunction 08/10/2014   Sick sinus syndrome (HCC) 07/17/2014   Overview:  Overview:  sinoatrial node dysfunction with dual chamber pacemaker in situ; paroxysmal AFib   Sinoatrial node dysfunction (HCC) 01/20/2014   Syncope and collapse 01/20/2014    Surgical History: Past Surgical History:  Procedure Laterality Date   CARDIAC ELECTROPHYSIOLOGY MAPPING AND ABLATION     ESOPHAGOGASTRODUODENOSCOPY (EGD) WITH PROPOFOL N/A 08/17/2022   Procedure: ESOPHAGOGASTRODUODENOSCOPY (EGD) WITH PROPOFOL;  Surgeon: Toney Reil, MD;  [facility];  Service: Gastroenterology;  Laterality: N/A;   LEFT HEART CATH AND CORONARY ANGIOGRAPHY Left 09/20/2017   Procedure: LEFT HEART CATH AND CORONARY ANGIOGRAPHY;  Surgeon: Alwyn Pea, MD;  Location: [facility];  Service: Cardiovascular;  Laterality: Left;   LUNG LOBECTOMY  PACEMAKER IMPLANT  2014    Home Medications:  Allergies as of 08/23/2023       Reactions   Lisinopril    Other reaction(s): dont remember   Sulfa Antibiotics Other (See Comments)   GI UPSET        Medication List         Accurate as of August 23, 2023  2:05 PM. If you have any questions, ask your nurse or doctor.          STOP taking these medications    acetaminophen 325 MG tablet Commonly known as: TYLENOL   albuterol 108 (90 Base) MCG/ACT inhaler Commonly known as: VENTOLIN HFA       TAKE these medications    amitriptyline 50 MG tablet Commonly known as: ELAVIL Take 1 tablet (50 mg total) by mouth at bedtime.   atorvastatin 40 MG tablet Commonly known as: LIPITOR Take 40 mg by mouth daily.   cyanocobalamin 1000 MCG tablet Take 1,000 mcg by mouth daily.   finasteride 5 MG tablet Commonly known as: PROSCAR Take 1 tablet (5 mg total) by mouth daily.   gabapentin 100 MG capsule Commonly known as: NEURONTIN Take 100 mg by mouth at bedtime.   hydrocortisone 10 MG tablet Commonly known as: Cortef Take 1 tablets at 8am and 1 tablet at 3pm   losartan 25 MG tablet Commonly known as: COZAAR Take 25 mg by mouth daily with breakfast.   MULTIVITAMIN ADULT PO Take 1 tablet by mouth daily.   omeprazole 20 MG capsule Commonly known as: PRILOSEC Take 20 mg by mouth at bedtime.   rosuvastatin 20 MG tablet Commonly known as: CRESTOR Take 20 mg by mouth daily.   simvastatin 40 MG tablet Commonly known as: ZOCOR Take 1 tablet by mouth daily.   sotalol 80 MG tablet Commonly known as: BETAPACE Take 80 mg by mouth 2 (two) times daily.   warfarin 5 MG tablet Commonly known as: COUMADIN Take 5 mg by mouth daily at 4 PM.   ZyrTEC Allergy 10 MG tablet Generic drug: cetirizine 1 tablet Orally At bedtime for 30 day(s)        Allergies:  Allergies  Allergen Reactions   Lisinopril     Other reaction(s): dont remember   Sulfa Antibiotics Other (See Comments)    GI UPSET    Family History: Family History  Problem Relation Age of Onset   Bladder Cancer Neg Hx    Kidney cancer Neg Hx    Prostate cancer Neg Hx     Social History:  reports that he has quit smoking. He  has quit using smokeless tobacco. He reports that he does not drink alcohol and does not use drugs.   Physical Exam: BP (!) 142/89   Pulse 87   Ht 5\' 7"  (1.702 m)   Wt 152 lb (68.9 kg)   BMI 23.81 kg/m   Constitutional:  Alert and oriented, No acute distress. HEENT: [place] AT Respiratory: Normal respiratory effort, no increased work of breathing. Psychiatric: Normal mood and affect.  Assessment & Plan:    1. BPH with lower urinary tract symptoms Stable on finasteride, refill sent to pharmacy.  1 year follow-up with PVR.   2. Chronic prostitis/chronic pelvic pain syndrome Stable Amitriptyline refilled at 50 mg  I have reviewed the above documentation for accuracy and completeness, and I agree with the above.   Riki Altes, MD  [facility] [address], [address] [place], [place] [address] [phone]  [phone]  

## 2024-01-22 ENCOUNTER — Ambulatory Visit
Admission: RE | Admit: 2024-01-22 | Discharge: 2024-01-22 | Disposition: A | Discharge status: Home / Self Care | Source: Ambulatory Visit | Attending: Cardiology | Admitting: Cardiology

## 2024-01-22 ENCOUNTER — Other Ambulatory Visit: Payer: Self-pay

## 2024-01-22 ENCOUNTER — Encounter: Payer: Self-pay | Admitting: Cardiology

## 2024-01-22 ENCOUNTER — Encounter
Admission: RE | Disposition: A | Discharge status: Home / Self Care | Payer: Self-pay | Source: Ambulatory Visit | Attending: Cardiology

## 2024-01-22 DIAGNOSIS — Z45010 Encounter for checking and testing of cardiac pacemaker pulse generator [battery]: Secondary | ICD-10-CM | POA: Insufficient documentation

## 2024-01-22 HISTORY — PX: PPM GENERATOR CHANGEOUT: EP1233

## 2024-01-22 SURGERY — PPM GENERATOR CHANGEOUT
Anesthesia: Moderate Sedation

## 2024-01-22 MED ORDER — ONDANSETRON HCL 4 MG/2ML IJ SOLN: 4.0000 mg | Freq: Four times a day (QID) | INTRAMUSCULAR | Status: DC | PRN

## 2024-01-22 MED ORDER — HEPARIN (PORCINE) IN NACL 1000-0.9 UT/500ML-% IV SOLN
INTRAVENOUS | Status: AC
  Filled 2024-01-22: qty 1000

## 2024-01-22 MED ORDER — SODIUM CHLORIDE 0.9 % IV SOLN: INTRAVENOUS | Status: DC

## 2024-01-22 MED ORDER — LIDOCAINE HCL 1 % IJ SOLN
INTRAMUSCULAR | Status: AC
Start: 2024-01-22 — End: 2024-01-22
  Filled 2024-01-22: qty 60

## 2024-01-22 MED ORDER — CEFAZOLIN SODIUM-DEXTROSE 2-4 GM/100ML-% IV SOLN
INTRAVENOUS | Status: AC
  Filled 2024-01-22: qty 100

## 2024-01-22 MED ORDER — CEPHALEXIN 500 MG PO CAPS: 500.0000 mg | ORAL_CAPSULE | Freq: Four times a day (QID) | ORAL | 0 refills | Status: AC

## 2024-01-22 MED ORDER — MIDAZOLAM HCL 2 MG/2ML IJ SOLN
INTRAMUSCULAR | Status: AC
  Filled 2024-01-22: qty 2

## 2024-01-22 MED ORDER — CEFAZOLIN SODIUM-DEXTROSE 2-4 GM/100ML-% IV SOLN: 2.0000 g | INTRAVENOUS | Status: DC

## 2024-01-22 MED ORDER — MIDAZOLAM HCL 2 MG/2ML IJ SOLN
INTRAMUSCULAR | Status: DC | PRN
  Administered 2024-01-22: 1 mg via INTRAVENOUS

## 2024-01-22 MED ORDER — SODIUM CHLORIDE 0.9 % IV SOLN
INTRAVENOUS | Status: DC | PRN
  Administered 2024-01-22: 80 mg

## 2024-01-22 MED ORDER — LIDOCAINE HCL (PF) 1 % IJ SOLN
INTRAMUSCULAR | Status: DC | PRN
Start: 2024-01-22 — End: 2024-01-22
  Administered 2024-01-22: 5 mL

## 2024-01-22 MED ORDER — ACETAMINOPHEN 325 MG PO TABS: 325.0000 mg | ORAL_TABLET | ORAL | Status: DC | PRN

## 2024-01-22 MED ORDER — HEPARIN (PORCINE) IN NACL 1000-0.9 UT/500ML-% IV SOLN
INTRAVENOUS | Status: DC | PRN
Start: 2024-01-22 — End: 2024-01-22
  Administered 2024-01-22: 1000 mL

## 2024-01-22 MED ORDER — SODIUM CHLORIDE 0.9 % IV SOLN
80.0000 mg | INTRAVENOUS | Status: DC
  Filled 2024-01-22: qty 2

## 2024-01-22 MED ORDER — FENTANYL CITRATE (PF) 100 MCG/2ML IJ SOLN
INTRAMUSCULAR | Status: AC
  Filled 2024-01-22: qty 2

## 2024-01-22 MED ORDER — CEFAZOLIN SODIUM-DEXTROSE 1-4 GM/50ML-% IV SOLN
INTRAVENOUS | Status: DC | PRN
  Administered 2024-01-22: 2 g via INTRAVENOUS

## 2024-01-22 MED ORDER — FENTANYL CITRATE (PF) 100 MCG/2ML IJ SOLN
INTRAMUSCULAR | Status: DC | PRN
  Administered 2024-01-22: 25 ug via INTRAVENOUS

## 2024-01-22 SURGICAL SUPPLY — 9 items
DEVICE DSSCT PLSMBLD 3.0S LGHT (MISCELLANEOUS) IMPLANT
DRAPE INCISE 23X17 STRL (DRAPES) IMPLANT
IPG PACE AZUR XT DR MRI W1DR01 (Pacemaker) IMPLANT
KIT SYRINGE INJ CVI SPIKEX1 (MISCELLANEOUS) IMPLANT
PAD ELECT DEFIB RADIOL ZOLL (MISCELLANEOUS) IMPLANT
POUCH AIGIS-R ANTIBACT PPM MED (Mesh General) IMPLANT
SUT VIC AB 2-0 CT1 TAPERPNT 27 (SUTURE) IMPLANT
SUT VIC AB 4-0 PS2 18 (SUTURE) IMPLANT
TRAY PACEMAKER INSERTION (PACKS) ×1 IMPLANT

## 2024-01-22 NOTE — Discharge Instructions (Signed)
 Patient may shower 01/24/2024 at which time may remove outer bandage.  Leave Steri-Strips on.  Resume warfarin 01/23/2024 previous dose.

## 2024-01-22 NOTE — Progress Notes (Signed)
 PT is on Coumadin . PT/INR was drawn per orders. Note sent to Dr. Ammon, and he responded no need to PT/INR, so order canceled per MD.

## 2024-01-23 ENCOUNTER — Encounter: Payer: Self-pay | Admitting: Cardiology

## 2024-07-25 ENCOUNTER — Other Ambulatory Visit: Payer: Self-pay | Admitting: Urology

## 2024-08-20 ENCOUNTER — Ambulatory Visit: Admitting: Urology

## 2024-08-22 ENCOUNTER — Ambulatory Visit: Payer: Medicare Other | Admitting: Urology
